# Patient Record
Sex: Female | Born: 1990 | Race: Black or African American | Hispanic: No | Marital: Single | Smoking: Never smoker
Health system: Southern US, Community
[De-identification: ages and names within clinical notes are randomized; demographics above are authoritative.]

## PROBLEM LIST (undated history)

## (undated) ENCOUNTER — Inpatient Hospital Stay (HOSPITAL_COMMUNITY): Payer: Self-pay

## (undated) ENCOUNTER — Inpatient Hospital Stay: Payer: Self-pay

## (undated) ENCOUNTER — Emergency Department (HOSPITAL_BASED_OUTPATIENT_CLINIC_OR_DEPARTMENT_OTHER): Payer: Self-pay | Source: Home / Self Care

## (undated) DIAGNOSIS — Z9109 Other allergy status, other than to drugs and biological substances: Secondary | ICD-10-CM

## (undated) DIAGNOSIS — O1490 Unspecified pre-eclampsia, unspecified trimester: Secondary | ICD-10-CM

---
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
## 2011-03-09 ENCOUNTER — Encounter (HOSPITAL_COMMUNITY): Payer: Self-pay

## 2011-03-09 ENCOUNTER — Inpatient Hospital Stay (HOSPITAL_COMMUNITY)
Admission: AD | Admit: 2011-03-09 | Discharge: 2011-03-09 | Disposition: A | Discharge status: Home / Self Care | Payer: Self-pay | Source: Ambulatory Visit | Attending: Obstetrics & Gynecology | Admitting: Obstetrics & Gynecology

## 2011-03-09 DIAGNOSIS — R3129 Other microscopic hematuria: Secondary | ICD-10-CM

## 2011-03-09 DIAGNOSIS — N898 Other specified noninflammatory disorders of vagina: Secondary | ICD-10-CM | POA: Insufficient documentation

## 2011-03-09 DIAGNOSIS — O99891 Other specified diseases and conditions complicating pregnancy: Secondary | ICD-10-CM | POA: Insufficient documentation

## 2011-03-09 DIAGNOSIS — Z3400 Encounter for supervision of normal first pregnancy, unspecified trimester: Secondary | ICD-10-CM

## 2011-03-09 LAB — URINALYSIS, ROUTINE W REFLEX MICROSCOPIC
Glucose, UA: NEGATIVE mg/dL
Nitrite: NEGATIVE
Specific Gravity, Urine: 1.015 (ref 1.005–1.030)
pH: 6.5 (ref 5.0–8.0)

## 2011-03-09 LAB — URINE MICROSCOPIC-ADD ON

## 2011-03-09 NOTE — Progress Notes (Signed)
Pt states started noticing red blood on toilet paper when wipes this morning. Reports positive fetal movement. Denies contractions.

## 2011-03-09 NOTE — ED Provider Notes (Signed)
Chief Complaint:  Vaginal Bleeding  Paula Castro is  20 y.o. G1P0000 at [redacted]w[redacted]d presents complaining of Vaginal Bleeding She reports noting some spots of blood on the tissue when she wipes after going to the bathroom to urinate.  Denies having hemorrhoids, recent intercourse or vulvovaginal irritation.  She denies LOF, contractions but reports good FM.  She gets her [facility] but plans to transfer her care to here as she lives here.   Obstetrical/Gynecological History: Menstrual History: OB History    Grav Para Term Preterm Abortions TAB SAB Ect Mult Living   1 0 0 0 0 0 0 0 0 0      No LMP recorded. Patient is pregnant.   Past Medical History: Past Medical History  Diagnosis Date  . No pertinent past medical history    Past Surgical History: Past Surgical History  Procedure Date  . Tonsillectomy 2004   Family History: No family history on file.  Social History: History  Substance Use Topics  . Smoking status: Never Smoker   . Smokeless tobacco: Not on file  . Alcohol Use: No    Allergies: No Known Allergies  Meds:  Prescriptions prior to admission  Medication Sig Dispense Refill  . prenatal vitamin w/FE, FA (PRENATAL 1 + 1) 27-1 MG TABS Take 1 tablet by mouth daily.        Marland Kitchen PRESCRIPTION MEDICATION Apply 1 application topically 2 (two) times daily as needed. For rash         Physical Exam  Blood pressure 136/83, temperature 98.4 F (36.9 C), temperature source Oral, resp. rate 20, height 5\' 3"  (1.6 m), weight 89.359 kg (197 lb), SpO2 97.00%. GENERAL: Well-developed, well-nourished female in no acute distress.  LUNGS: Clear to auscultation bilaterally.  HEART: Regular rate and rhythm. ABDOMEN: Soft, nontender, nondistended, gravid.  EXTREMITIES: Nontender, no edema, 2+ distal pulses. Speculum Exam: Closed cervix, white vaginal discharge.  No evidence of past or ongoing bleeding.  No lesions.  Normal external genitalia, no lesions, no evidence of  vulvovaginitis. FHT:  Baseline rate 150 bpm   Variability moderate  Accelerations absent   Decelerations none -> reassuring for GA Contractions: None   Labs: Recent Results (from the past 24 hour(s))  URINALYSIS, ROUTINE W REFLEX MICROSCOPIC   Collection Time   03/09/11  9:05 PM      Component Value Range   Color, Urine YELLOW  YELLOW    Appearance CLEAR  CLEAR    Specific Gravity, Urine 1.015  1.005 - 1.030    pH 6.5  5.0 - 8.0    Glucose, UA NEGATIVE  NEGATIVE (mg/dL)   Hgb urine dipstick TRACE (*) NEGATIVE    Bilirubin Urine NEGATIVE  NEGATIVE    Ketones, ur >80 (*) NEGATIVE (mg/dL)   Protein, ur NEGATIVE  NEGATIVE (mg/dL)   Urobilinogen, UA 0.2  0.0 - 1.0 (mg/dL)   Nitrite NEGATIVE  NEGATIVE    Leukocytes, UA LARGE (*) NEGATIVE   URINE MICROSCOPIC-ADD ON   Collection Time   03/09/11  9:05 PM      Component Value Range   Squamous Epithelial / LPF FEW (*) RARE    WBC, UA 21-50  <3 (WBC/hpf)   RBC / HPF 0-2  <3 (RBC/hpf)   Bacteria, UA RARE  RARE    Urine-Other MUCOUS PRESENT       Assessment: Paula Castro is  20 y.o. G1P0000 at [redacted]w[redacted]d presents with reported bleeding; no evidence/etiology of bleeding seen on exam.  UA ordered  to rule out UTI.  Plan: UA showed large LE and trace hemoglobin, no nitrites.  Will send for culture and manage accordingly, low suspicion for UTI.  No evidence of kidney stones. No other bleeding noted. Reassuring FHT. Discharge to home; FM/PTL and bleeding precautions reviewed. Clinic number given to patient; she will call and make appointment and arrange for transfer of records.  Paula Castro A 9/1/20129:39 PM

## 2011-03-12 ENCOUNTER — Encounter (HOSPITAL_COMMUNITY): Payer: Self-pay | Admitting: *Deleted

## 2011-03-12 ENCOUNTER — Inpatient Hospital Stay (HOSPITAL_COMMUNITY)
Admission: AD | Admit: 2011-03-12 | Discharge: 2011-03-12 | Disposition: A | Discharge status: Home / Self Care | Payer: Medicaid Other | Source: Ambulatory Visit | Attending: Obstetrics & Gynecology | Admitting: Obstetrics & Gynecology

## 2011-03-12 DIAGNOSIS — O2340 Unspecified infection of urinary tract in pregnancy, unspecified trimester: Secondary | ICD-10-CM

## 2011-03-12 DIAGNOSIS — O2390 Unspecified genitourinary tract infection in pregnancy, unspecified trimester: Secondary | ICD-10-CM | POA: Insufficient documentation

## 2011-03-12 DIAGNOSIS — B9689 Other specified bacterial agents as the cause of diseases classified elsewhere: Secondary | ICD-10-CM | POA: Insufficient documentation

## 2011-03-12 DIAGNOSIS — N390 Urinary tract infection, site not specified: Secondary | ICD-10-CM | POA: Insufficient documentation

## 2011-03-12 HISTORY — DX: Other allergy status, other than to drugs and biological substances: Z91.09

## 2011-03-12 LAB — URINE CULTURE

## 2011-03-12 MED ORDER — NITROFURANTOIN MONOHYD MACRO 100 MG PO CAPS: 100.0000 mg | ORAL_CAPSULE | Freq: Two times a day (BID) | ORAL | Status: AC

## 2011-03-12 NOTE — ED Provider Notes (Signed)
Chief Complaint:  No chief complaint on file.   Paula Castro is  20 y.o. G1P0000 at [redacted]w[redacted]d presents complaining of blood on toilet paper after urination.  She was seen in MAU on 9/2 for the same issue and told to return if additional episodes of bleeding.  She has noticed blood with wiping after urination yesterday and today.  Also complains of suprapubic pain, no dysuria.  Denies fever, chills, n/v, diarrhea, constipation. A UA done on the 2nd showed LE, few red cells, and many white cells.  A culture was done and is showing >55,000 colonies of gram negative rods.  No contractions, loss of fluid or vaginal bleeding.  Reports good fetal movement.     Obstetrical/Gynecological History: OB History    Grav Para Term Preterm Abortions TAB SAB Ect Mult Living   1 0 0 0 0 0 0 0 0 0       Past Medical History: Past Medical History  Diagnosis Date  . Environmental allergies     Past Surgical History: Past Surgical History  Procedure Date  . Tonsillectomy 2004    Family History: No family history on file.  Social History: History  Substance Use Topics  . Smoking status: Never Smoker   . Smokeless tobacco: Never Used  . Alcohol Use: No    Allergies: No Known Allergies  Prescriptions prior to admission  Medication Sig Dispense Refill  . clobetasol (TEMOVATE) 0.05 % ointment Apply 1 application topically 2 (two) times daily as needed. For rash/itching       . prenatal vitamin w/FE, FA (PRENATAL 1 + 1) 27-1 MG TABS Take 1 tablet by mouth daily.       Marland Kitchen PRESCRIPTION MEDICATION Apply 1 application topically 2 (two) times daily as needed. For rash        Review of Systems - Negative except as in HPI  Physical Exam   Blood pressure 143/74, pulse 106, temperature 99.2 F (37.3 C), temperature source Oral, resp. rate 20, height 5\' 3"  (1.6 m), weight 193 lb 3.2 oz (87.635 kg), SpO2 100.00%.  General: General appearance - alert, well appearing, and in no distress Eyes - sclera  anicteric Mouth - mucous membranes moist, pharynx normal without lesions Chest - clear to auscultation, no wheezes, rales or rhonchi, symmetric air entry Heart - normal rate, regular rhythm, normal S1, S2, no murmurs, rubs, clicks or gallops Abdomen - soft, +BS, gravid Extremities - peripheral pulses normal, no pedal edema, no clubbing or cyanosis Skin - normal coloration and turgor, no rashes, no suspicious skin lesions noted Baseline: 150 bpm, Variability: Good {> 6 bpm), Accelerations: Reactive and Decelerations: Absent none Cervix: closed/thick/posterior  Labs: No results found for this or any previous visit (from the past 24 hour(s)). Imaging Studies:  No results found.   Assessment: Paula Castro is  20 y.o. G1P0000 at [redacted]w[redacted]d presents with minimal bleeding after urination.  Urine culture from 9/2 growing >55,000 colonies of gram negative rods.  Plan: -Macrobid 100mg  BID x7 days -preterm labor handout provided -discharge home  Patient and plan discussed with Cam Hai, CNM BOOTH, ERIN9/4/20126:04 PM

## 2011-03-12 NOTE — Progress Notes (Signed)
Pt states she was seen in MAU on 9-2 and had some blood and WBC's in her urine. Was instructed if she saw blood again to return to MAU. Pt is not having any pain and only sees the blood with wiping after urinating. Pt gets prenatal care in [place] but plans to transfer to [place]. No contractions or leaking.

## 2011-03-18 MED ORDER — METRONIDAZOLE 500 MG PO TABS: 500.0000 mg | ORAL_TABLET | Freq: Two times a day (BID) | ORAL | Status: AC

## 2011-03-18 MED ORDER — CEPHALEXIN 500 MG PO CAPS: 500.0000 mg | ORAL_CAPSULE | Freq: Three times a day (TID) | ORAL | Status: AC

## 2011-03-18 NOTE — Progress Notes (Signed)
Quick Note:  I called patient with the results below. Discussed Trichomonas diagnosis; advised to get partner tested and also advised full STI screening by her OB provider. E-prescribed seven day courses of Keflex 500 mg po tid (for her UTI) and Flagyl 500 mg po bid (Trichomonas). She will follow up with her OB provider.  Jaslyn Bansal A 03/18/2011 4:46 PM   ______

## 2011-03-18 NOTE — Progress Notes (Signed)
Encounter addended by: Tereso Newcomer, MD on: 03/18/2011  4:50 PM<BR>     Documentation filed: Orders

## 2011-03-19 ENCOUNTER — Inpatient Hospital Stay (HOSPITAL_COMMUNITY)
Admission: AD | Admit: 2011-03-19 | Discharge: 2011-03-19 | Disposition: A | Discharge status: Home / Self Care | Payer: Self-pay | Source: Ambulatory Visit | Attending: Obstetrics and Gynecology | Admitting: Obstetrics and Gynecology

## 2011-03-19 NOTE — Plan of Care (Signed)
Pt and her mother in to triage. Pt had been seen in MAU 9-1 and given medication for a UTI. Dr. Macon Large had called the patient last night with the result of the urine culture done on that date. The urine had indicated trich on the micro and the MD had talked with the patient about this result and called in an Rx for treatment and indicated the FOB needed to be treated. Pt and her mother are confused about this result. Pt states she has received care in [facility] and had tested negative for everything in the beginning of the pregnancy. Pt had questioned our result and wanted to be retested.  This RN pulled lab results and talked with the patient and her mother about the results. Indicated to both that the lab is accurate and that both she and her partner need to be treated as indicated by the MD.. Pt states that her partner told her that he was tested yesterday and did not have anything.  This RN reassured the patient that both needed to be treated, reemphasizing the MD instructions. Pt now understands better the result and does not want to have further testing done. Pt states she will follow up with her primary provider in [place]. Pt and mother left MAU.

## 2011-03-23 NOTE — ED Provider Notes (Signed)
I have discussed this pt with Dr Elwyn Reach and I agree with the above. Paula Castro 8:17 PM 03/23/2011

## 2011-08-22 ENCOUNTER — Encounter (HOSPITAL_COMMUNITY): Payer: Self-pay | Admitting: *Deleted

## 2011-09-23 ENCOUNTER — Emergency Department: Payer: Self-pay | Admitting: Emergency Medicine

## 2013-06-26 ENCOUNTER — Emergency Department: Payer: Self-pay | Admitting: Emergency Medicine

## 2013-06-28 LAB — BETA STREP CULTURE(ARMC)

## 2013-10-15 ENCOUNTER — Emergency Department: Payer: Self-pay | Admitting: Emergency Medicine

## 2013-10-15 LAB — URINALYSIS, COMPLETE
BACTERIA: NONE SEEN
BILIRUBIN, UR: NEGATIVE
Blood: NEGATIVE
Glucose,UR: NEGATIVE mg/dL (ref 0–75)
KETONE: NEGATIVE
LEUKOCYTE ESTERASE: NEGATIVE
NITRITE: NEGATIVE
PH: 7 (ref 4.5–8.0)
Protein: NEGATIVE
RBC,UR: <1 /HPF (ref 0–5)
Specific Gravity: 1.024 (ref 1.003–1.030)

## 2013-10-15 LAB — COMPREHENSIVE METABOLIC PANEL
ALK PHOS: 76 U/L
ALT: 18 U/L (ref 12–78)
AST: 17 U/L (ref 15–37)
Albumin: 3.8 g/dL (ref 3.4–5.0)
Anion Gap: 5 — ABNORMAL LOW (ref 7–16)
BILIRUBIN TOTAL: 0.2 mg/dL (ref 0.2–1.0)
BUN: 10 mg/dL (ref 7–18)
CREATININE: 0.79 mg/dL (ref 0.60–1.30)
Calcium, Total: 9.1 mg/dL (ref 8.5–10.1)
Chloride: 108 mmol/L — ABNORMAL HIGH (ref 98–107)
Co2: 27 mmol/L (ref 21–32)
EGFR (African American): >60
EGFR (Non-African Amer.): >60
GLUCOSE: 101 mg/dL — AB (ref 65–99)
Osmolality: 279 (ref 275–301)
POTASSIUM: 3.7 mmol/L (ref 3.5–5.1)
Sodium: 140 mmol/L (ref 136–145)
Total Protein: 8 g/dL (ref 6.4–8.2)

## 2013-10-15 LAB — CBC
HCT: 40 % (ref 35.0–47.0)
HGB: 13.5 g/dL (ref 12.0–16.0)
MCH: 31.1 pg (ref 26.0–34.0)
MCHC: 33.8 g/dL (ref 32.0–36.0)
MCV: 92 fL (ref 80–100)
PLATELETS: 193 10*3/uL (ref 150–440)
RBC: 4.35 10*6/uL (ref 3.80–5.20)
RDW: 12.7 % (ref 11.5–14.5)
WBC: 5.7 10*3/uL (ref 3.6–11.0)

## 2013-10-16 ENCOUNTER — Encounter (HOSPITAL_COMMUNITY): Payer: Self-pay | Admitting: *Deleted

## 2013-10-16 ENCOUNTER — Inpatient Hospital Stay (HOSPITAL_COMMUNITY)
Admission: AD | Admit: 2013-10-16 | Discharge: 2013-10-16 | Disposition: A | Discharge status: Home / Self Care | Payer: Self-pay | Source: Ambulatory Visit | Attending: Obstetrics & Gynecology | Admitting: Obstetrics & Gynecology

## 2013-10-16 DIAGNOSIS — R4189 Other symptoms and signs involving cognitive functions and awareness: Secondary | ICD-10-CM

## 2013-10-16 DIAGNOSIS — R419 Unspecified symptoms and signs involving cognitive functions and awareness: Secondary | ICD-10-CM

## 2013-10-16 DIAGNOSIS — N898 Other specified noninflammatory disorders of vagina: Secondary | ICD-10-CM | POA: Insufficient documentation

## 2013-10-16 LAB — URINALYSIS, ROUTINE W REFLEX MICROSCOPIC
BILIRUBIN URINE: NEGATIVE
GLUCOSE, UA: NEGATIVE mg/dL
HGB URINE DIPSTICK: NEGATIVE
KETONES UR: NEGATIVE mg/dL
Leukocytes, UA: NEGATIVE
Nitrite: NEGATIVE
PH: 7 (ref 5.0–8.0)
Protein, ur: NEGATIVE mg/dL
SPECIFIC GRAVITY, URINE: 1.02 (ref 1.005–1.030)
Urobilinogen, UA: 0.2 mg/dL (ref 0.0–1.0)

## 2013-10-16 LAB — WET PREP, GENITAL
Clue Cells Wet Prep HPF POC: NONE SEEN
TRICH WET PREP: NONE SEEN
YEAST WET PREP: NONE SEEN

## 2013-10-16 LAB — GC/CHLAMYDIA PROBE AMP

## 2013-10-16 NOTE — MAU Provider Note (Signed)
History     CSN: [id number]  Arrival date and time: 10/16/13 3832   First Provider Initiated Contact with Patient 10/16/13 1124      Chief Complaint  Patient presents with  . Vaginal Discharge   HPI Paula Castro 22 y.o. Client went to [facility] yesterday for discharge and was not told anything.  Comes here today for discharge with odor.  Thinks she has BV.  OB History   Grav Para Term Preterm Abortions TAB SAB Ect Mult Living   1 0 0 0 0 0 0 0 0 0       Past Medical History  Diagnosis Date  . Environmental allergies     Past Surgical History  Procedure Laterality Date  . Tonsillectomy  2004    History reviewed. No pertinent family history.  History  Substance Use Topics  . Smoking status: Never Smoker   . Smokeless tobacco: Never Used  . Alcohol Use: No    Allergies: No Known Allergies  Prescriptions prior to admission  Medication Sig Dispense Refill  . acetaminophen (TYLENOL) 325 MG tablet Take 650 mg by mouth every 6 (six) hours as needed for moderate pain.        Review of Systems  Constitutional: Negative for fever.  Gastrointestinal: Negative for nausea, vomiting, abdominal pain, diarrhea and constipation.  Genitourinary:       Vaginal discharge. No vaginal bleeding. No dysuria.   Physical Exam   Blood pressure 145/81, pulse 88, temperature 97.9 F (36.6 C), resp. rate 18, height 5\' 2"  (1.575 m), weight 163 lb (73.936 kg), last menstrual period 10/06/2013, unknown if currently breastfeeding.  Physical Exam  Nursing note and vitals reviewed. Constitutional: She is oriented to person, place, and time. She appears well-developed and well-nourished. No distress.  HENT:  Head: Normocephalic.  Eyes: EOM are normal.  Neck: Neck supple.  GI: Soft. There is no tenderness.  Genitourinary:  Speculum exam: Vagina - Minimal amount of creamy discharge, no odor Cervix - No contact bleeding Bimanual exam: Cervix closed Uterus non tender, normal  size Adnexa non tender, no masses bilaterally GC/Chlam, wet prep done Chaperone present for exam.  Musculoskeletal: Normal range of motion.  Neurological: She is alert and oriented to person, place, and time.  Skin: Skin is warm and dry.  Psychiatric: She has a normal mood and affect.    MAU Course  Procedures  MDM Results for orders placed during the hospital encounter of 10/16/13 (from the past 24 hour(s))  URINALYSIS, ROUTINE W REFLEX MICROSCOPIC     Status: None   Collection Time    10/16/13 10:18 AM      Result Value Ref Range   Color, Urine YELLOW  YELLOW   APPearance CLEAR  CLEAR   Specific Gravity, Urine 1.020  1.005 - 1.030   pH 7.0  5.0 - 8.0   Glucose, UA NEGATIVE  NEGATIVE mg/dL   Hgb urine dipstick NEGATIVE  NEGATIVE   Bilirubin Urine NEGATIVE  NEGATIVE   Ketones, ur NEGATIVE  NEGATIVE mg/dL   Protein, ur NEGATIVE  NEGATIVE mg/dL   Urobilinogen, UA 0.2  0.0 - 1.0 mg/dL   Nitrite NEGATIVE  NEGATIVE   Leukocytes, UA NEGATIVE  NEGATIVE  WET PREP, GENITAL     Status: Abnormal   Collection Time    10/16/13 11:29 AM      Result Value Ref Range   Yeast Wet Prep HPF POC NONE SEEN  NONE SEEN   Trich, Wet Prep NONE SEEN  NONE  SEEN   Clue Cells Wet Prep HPF POC NONE SEEN  NONE SEEN   WBC, Wet Prep HPF POC MODERATE (*) NONE SEEN     Assessment and Plan  No infection found. Normal exam  Plan Cultures pending. Establish with a provider for medical concerns. Will contact you with any positive results for treatment.  Havard Radigan L Render Marley 10/16/2013, 12:19 PM

## 2013-10-16 NOTE — MAU Note (Signed)
Pt presents with complaints of a vaginal discharge. She says she went to [facility] last night and was told she had a pelvic infection but they  did not treat with any meds and she states she still has a vaginal discharge.

## 2013-10-16 NOTE — Discharge Instructions (Signed)
Establish with a health care provider to be able to see you for your medical needs.

## 2013-10-16 NOTE — MAU Provider Note (Signed)
Attestation of Attending Supervision of Advanced Practitioner (PA/CNM/NP): Evaluation and management procedures were performed by the Advanced Practitioner under my supervision and collaboration.  I have reviewed the Advanced Practitioner's note and chart, and I agree with the management and plan.  Ulonda Klosowski, MD, FACOG Attending Obstetrician & Gynecologist Faculty Practice, [facility]  

## 2013-10-18 LAB — POCT PREGNANCY, URINE: PREG TEST UR: NEGATIVE

## 2013-10-18 LAB — GC/CHLAMYDIA PROBE AMP
CT Probe RNA: NEGATIVE
GC PROBE AMP APTIMA: NEGATIVE

## 2013-11-30 ENCOUNTER — Emergency Department: Payer: Self-pay | Admitting: Emergency Medicine

## 2014-05-09 ENCOUNTER — Encounter (HOSPITAL_COMMUNITY): Payer: Self-pay | Admitting: *Deleted

## 2015-05-03 ENCOUNTER — Encounter: Payer: Self-pay | Admitting: Emergency Medicine

## 2015-05-03 ENCOUNTER — Emergency Department
Admission: EM | Admit: 2015-05-03 | Discharge: 2015-05-03 | Disposition: A | Discharge status: Home / Self Care | Payer: Self-pay | Attending: Emergency Medicine | Admitting: Emergency Medicine

## 2015-05-03 DIAGNOSIS — N3000 Acute cystitis without hematuria: Secondary | ICD-10-CM | POA: Insufficient documentation

## 2015-05-03 DIAGNOSIS — Z3202 Encounter for pregnancy test, result negative: Secondary | ICD-10-CM | POA: Insufficient documentation

## 2015-05-03 LAB — URINALYSIS COMPLETE WITH MICROSCOPIC (ARMC ONLY)
BACTERIA UA: NONE SEEN
BILIRUBIN URINE: NEGATIVE
Glucose, UA: NEGATIVE mg/dL
KETONES UR: NEGATIVE mg/dL
NITRITE: NEGATIVE
PH: 5 (ref 5.0–8.0)
PROTEIN: 100 mg/dL — AB
Specific Gravity, Urine: 1.025 (ref 1.005–1.030)

## 2015-05-03 LAB — POCT PREGNANCY, URINE: Preg Test, Ur: NEGATIVE

## 2015-05-03 MED ORDER — CIPROFLOXACIN HCL 500 MG PO TABS: 500.0000 mg | ORAL_TABLET | Freq: Two times a day (BID) | ORAL | Status: DC

## 2015-05-03 MED ORDER — CEPHALEXIN 500 MG PO CAPS
500.0000 mg | ORAL_CAPSULE | Freq: Once | ORAL | Status: AC
  Administered 2015-05-03: 500 mg via ORAL
  Filled 2015-05-03: qty 1

## 2015-05-03 MED ORDER — CIPROFLOXACIN HCL 500 MG PO TABS: 500.0000 mg | ORAL_TABLET | Freq: Once | ORAL | Status: DC

## 2015-05-03 MED ORDER — CEPHALEXIN 500 MG PO CAPS: 500.0000 mg | ORAL_CAPSULE | Freq: Two times a day (BID) | ORAL | Status: AC

## 2015-05-03 NOTE — ED Notes (Signed)
Pt uprite on stretcher in exam room with no distress noted; pt reports pelvic pressure with urination and light pink blood; denies abd pain or back pain; st hx of same with UTI

## 2015-05-03 NOTE — ED Notes (Signed)
Pt presents to ED with lower abd / pelvic pressure since yesterday. Denies similar symptoms previously. Pt states she also noticed a light pink blood when wiping yesterda evening and today.

## 2015-05-03 NOTE — Discharge Instructions (Signed)

## 2015-05-03 NOTE — ED Provider Notes (Signed)
[facility] Emergency Department Provider Note  ____________________________________________  Time seen: 6:30 AM  I have reviewed the triage vital signs and the nursing notes.   HISTORY  Chief Complaint Hematuria and Pelvic Pain     HPI Paula Castro is a 24 y.o. female presents with pelvic pressure and light pink blood with wiping noted yesterday evening. Patient denies any dysuria no vaginal discharge patient describes pelvic pressure. Patient denies any fever    Past Medical History  Diagnosis Date  . Environmental allergies     Patient Active Problem List   Diagnosis Date Noted  . Hematuria, microscopic 03/09/2011  . Supervision of normal first pregnancy 03/09/2011    Past Surgical History  Procedure Laterality Date  . Tonsillectomy  2004    Current Outpatient Rx  Name  Route  Sig  Dispense  Refill  . acetaminophen (TYLENOL) 325 MG tablet   Oral   Take 650 mg by mouth every 6 (six) hours as needed for moderate pain.           Allergies No known drug allergies No family history on file.  Social History Social History  Substance Use Topics  . Smoking status: Never Smoker   . Smokeless tobacco: Never Used  . Alcohol Use: No    Review of Systems  Constitutional: Negative for fever. Eyes: Negative for visual changes. ENT: Negative for sore throat. Cardiovascular: Negative for chest pain. Respiratory: Negative for shortness of breath. Gastrointestinal: Negative for abdominal pain, vomiting and diarrhea. Genitourinary: Negative for dysuria. Positive for pelvic pressure Musculoskeletal: Negative for back pain. Skin: Negative for rash. Neurological: Negative for headaches, focal weakness or numbness.   10-point ROS otherwise negative.  ____________________________________________   PHYSICAL EXAM:  VITAL SIGNS: ED Triage Vitals  Enc Vitals Group     BP 05/03/15 0529 143/82 mmHg     Pulse Rate 05/03/15 0529 100   Resp 05/03/15 0529 18     Temp 05/03/15 0529 98.2 F (36.8 C)     Temp Source 05/03/15 0529 Oral     SpO2 05/03/15 0529 100 %     Weight 05/03/15 0529 174 lb (78.926 kg)     Height 05/03/15 0529 5\' 4"  (1.626 m)     Head Cir --      Peak Flow --      Pain Score 05/03/15 0530 6     Pain Loc --      Pain Edu? --      Excl. in GC? --     Constitutional: Alert and oriented. Well appearing and in no distress. Eyes: Conjunctivae are normal. PERRL. Normal extraocular movements. ENT   Head: Normocephalic and atraumatic.   Nose: No congestion/rhinnorhea.   Mouth/Throat: Mucous membranes are moist.   Neck: No stridor. Hematological/Lymphatic/Immunilogical: No cervical lymphadenopathy. Cardiovascular: Normal rate, regular rhythm. Normal and symmetric distal pulses are present in all extremities. No murmurs, rubs, or gallops. Respiratory: Normal respiratory effort without tachypnea nor retractions. Breath sounds are clear and equal bilaterally. No wheezes/rales/rhonchi. Gastrointestinal: Soft and nontender. No distention. There is no CVA tenderness. Genitourinary: deferred Musculoskeletal: Nontender with normal range of motion in all extremities. No joint effusions.  No lower extremity tenderness nor edema. Neurologic:  Normal speech and language. No gross focal neurologic deficits are appreciated. Speech is normal.  Skin:  Skin is warm, dry and intact. No rash noted. Psychiatric: Mood and affect are normal. Speech and behavior are normal. Patient exhibits appropriate insight and judgment.  ____________________________________________  LABS (pertinent positives/negatives)  Labs Reviewed  URINALYSIS COMPLETEWITH MICROSCOPIC ([facility] ONLY) - Abnormal; Notable for the following:    Color, Urine YELLOW (*)    APPearance TURBID (*)    Hgb urine dipstick 3+ (*)    Protein, ur 100 (*)    Leukocytes, UA 3+ (*)    Squamous Epithelial / LPF 0-5 (*)    All other components within  normal limits  POC URINE PREG, ED  POCT PREGNANCY, URINE        INITIAL IMPRESSION / ASSESSMENT AND PLAN / ED COURSE  Pertinent labs & imaging results that were available during my care of the patient were reviewed by me and considered in my medical decision making (see chart for details).  Strip physical exam consistent with urinary tract infection. Patient concerned about possibility of pregnancy stating that she had unprotected sex last week with her sexual partner ejaculated in the vaginal vault. I advised the patient to perform urine pregnancy test in approximately 2 weeks to confirm possibility of pregnancy.  ____________________________________________   FINAL CLINICAL IMPRESSION(S) / ED DIAGNOSES  Final diagnoses:  Acute cystitis without hematuria      Darci Current, MD 05/03/15 [phone]

## 2015-06-04 ENCOUNTER — Encounter: Payer: Self-pay | Admitting: Emergency Medicine

## 2015-06-04 ENCOUNTER — Emergency Department
Admission: EM | Admit: 2015-06-04 | Discharge: 2015-06-04 | Disposition: A | Discharge status: Home / Self Care | Payer: Self-pay | Attending: Emergency Medicine | Admitting: Emergency Medicine

## 2015-06-04 DIAGNOSIS — B9689 Other specified bacterial agents as the cause of diseases classified elsewhere: Secondary | ICD-10-CM

## 2015-06-04 DIAGNOSIS — R300 Dysuria: Secondary | ICD-10-CM

## 2015-06-04 DIAGNOSIS — N760 Acute vaginitis: Secondary | ICD-10-CM | POA: Insufficient documentation

## 2015-06-04 LAB — WET PREP, GENITAL
Sperm: NONE SEEN
Trich, Wet Prep: NONE SEEN
Yeast Wet Prep HPF POC: NONE SEEN

## 2015-06-04 LAB — URINALYSIS COMPLETE WITH MICROSCOPIC (ARMC ONLY)
BILIRUBIN URINE: NEGATIVE
Bacteria, UA: NONE SEEN
Glucose, UA: NEGATIVE mg/dL
Hgb urine dipstick: NEGATIVE
KETONES UR: NEGATIVE mg/dL
Leukocytes, UA: NEGATIVE
Nitrite: NEGATIVE
PH: 8 (ref 5.0–8.0)
Protein, ur: NEGATIVE mg/dL
RBC / HPF: NONE SEEN RBC/hpf (ref 0–5)
Specific Gravity, Urine: 1.018 (ref 1.005–1.030)

## 2015-06-04 LAB — CHLAMYDIA/NGC RT PCR (ARMC ONLY)
CHLAMYDIA TR: NOT DETECTED
N GONORRHOEAE: NOT DETECTED

## 2015-06-04 MED ORDER — METRONIDAZOLE 500 MG PO TABS: 500.0000 mg | ORAL_TABLET | Freq: Two times a day (BID) | ORAL | Status: AC

## 2015-06-04 NOTE — ED Notes (Addendum)
Patient states that she had a UTI about 1 month ago, patient states that her symptoms never fully went away. Patient is having increased urination with little output. Patient denies vaginal discharge and bleeding. Patient states that she is having some nausea. Has felt "hot" but has not taken her temperature. Patient is having lower back pain on both sides.

## 2015-06-04 NOTE — ED Notes (Signed)
Patient presents to the ED with dysuria, frequency, and halting urine for about 1 month.  Patient states she was treated with antibiotics for a UTI about 1 month ago but symptoms have not improved.  Patient is in no obvious distress at this time.

## 2015-06-04 NOTE — ED Provider Notes (Signed)
[facility] Emergency Department Provider Note ____________________________________________  Time seen: 1245  I have reviewed the triage vital signs and the nursing notes.  HISTORY  Chief Complaint  Dysuria  HPI Paula Castro is a 24 y.o. female reports to the ED for evaluation of bilateral flank pain and some urinary hesitation. She describes being treated for UTI about a month ago, but denies feeling as if she was completely cured after completing the antibiotic dose. She describes in the last week or so she's noted some increased nausea without vomiting, as well as some chills and subjective fevers. She denies any frank hematuria, pelvic pain, or outright dysuria. She also is without any vaginal discharge or irregular bleeding.  Past Medical History  Diagnosis Date  . Environmental allergies     Patient Active Problem List   Diagnosis Date Noted  . Hematuria, microscopic 03/09/2011  . Supervision of normal first pregnancy 03/09/2011    Past Surgical History  Procedure Laterality Date  . Tonsillectomy  2004   Current Outpatient Rx  Name  Route  Sig  Dispense  Refill  . metroNIDAZOLE (FLAGYL) 500 MG tablet   Oral   Take 1 tablet (500 mg total) by mouth 2 (two) times daily.   14 tablet   0     Allergies Review of patient's allergies indicates no known allergies.  No family history on file.  Social History Social History  Substance Use Topics  . Smoking status: Never Smoker   . Smokeless tobacco: Never Used  . Alcohol Use: No   Review of Systems  Constitutional: Negative for fever. Eyes: Negative for visual changes. ENT: Negative for sore throat. Cardiovascular: Negative for chest pain. Respiratory: Negative for shortness of breath. Gastrointestinal: Negative for abdominal pain, vomiting and diarrhea. Genitourinary: Negative for dysuria.  Musculoskeletal: Negative for back pain. Skin: Negative for rash. Neurological: Negative for  headaches, focal weakness or numbness. ____________________________________________  PHYSICAL EXAM:  VITAL SIGNS: ED Triage Vitals  Enc Vitals Group     BP 06/04/15 1231 140/84 mmHg     Pulse Rate 06/04/15 1231 103     Resp 06/04/15 1231 18     Temp 06/04/15 1231 97.7 F (36.5 C)     Temp src --      SpO2 06/04/15 1231 100 %     Weight 06/04/15 1231 175 lb (79.379 kg)     Height 06/04/15 1231  (1.626 m)     Head Cir --      Peak Flow --      Pain Score 06/04/15 1221 7     Pain Loc --      Pain Edu? --      Excl. in GC? --    Constitutional: Alert and oriented. Well appearing and in no distress. Head: Normocephalic and atraumatic.      Eyes: Conjunctivae are normal. PERRL. Normal extraocular movements      Ears: Canals clear. TMs intact bilaterally.   Nose: No congestion/rhinorrhea.   Mouth/Throat: Mucous membranes are moist.   Neck: Supple. No thyromegaly. Hematological/Lymphatic/Immunological: No cervical lymphadenopathy. Cardiovascular: Normal rate, regular rhythm.  Respiratory: Normal respiratory effort. No wheezes/rales/rhonchi. Gastrointestinal: Soft and nontender. No distention. GU: Normal external genitalia joint exam. Vaginal vault with a moderate amount of thin white discharge noted. Musculoskeletal: Nontender with normal range of motion in all extremities.  Neurologic:  Normal gait without ataxia. Normal speech and language. No gross focal neurologic deficits are appreciated. Skin:  Skin is warm, dry  and intact. No rash noted. Psychiatric: Mood and affect are normal. Patient exhibits appropriate insight and judgment. ____________________________________________   LABS (pertinent positives/negatives) Labs Reviewed  WET PREP, GENITAL - Abnormal; Notable for the following:    Clue Cells Wet Prep HPF POC FEW (*)    WBC, Wet Prep HPF POC FEW (*)    All other components within normal limits  URINALYSIS COMPLETEWITH MICROSCOPIC ([facility] ONLY) - Abnormal;  Notable for the following:    Color, Urine YELLOW (*)    APPearance CLEAR (*)    Squamous Epithelial / LPF 0-5 (*)    All other components within normal limits  CHLAMYDIA/NGC RT PCR ([facility] ONLY)  ____________________________________________  INITIAL IMPRESSION / ASSESSMENT AND PLAN / ED COURSE  Patient with dysuria without laboratory evidence of an acute cystitis. She'll be treated for a bacterial vaginosis with Flagyl. Gonorrhea chlamydia is pending at the time of discharge. Patient will be called with results if treatment is warranted, and advised to return to the ED or her provider for treatment. ____________________________________________  FINAL CLINICAL IMPRESSION(S) / ED DIAGNOSES  Final diagnoses:  BV (bacterial vaginosis)  Dysuria      Lissa Hoard, PA-C 06/04/15 1613  Jene Every, MD 06/05/15 1450

## 2015-06-04 NOTE — Discharge Instructions (Signed)
Bacterial Vaginosis Bacterial vaginosis is a vaginal infection that occurs when the normal balance of bacteria in the vagina is disrupted. It results from an overgrowth of certain bacteria. This is the most common vaginal infection in women of childbearing age. Treatment is important to prevent complications, especially in pregnant women, as it can cause a premature delivery. CAUSES  Bacterial vaginosis is caused by an increase in harmful bacteria that are normally present in smaller amounts in the vagina. Several different kinds of bacteria can cause bacterial vaginosis. However, the reason that the condition develops is not fully understood. RISK FACTORS Certain activities or behaviors can put you at an increased risk of developing bacterial vaginosis, including:  Having a new sex partner or multiple sex partners.  Douching.  Using an intrauterine device (IUD) for contraception. Women do not get bacterial vaginosis from toilet seats, bedding, swimming pools, or contact with objects around them. SIGNS AND SYMPTOMS  Some women with bacterial vaginosis have no signs or symptoms. Common symptoms include:  Grey vaginal discharge.  A fishlike odor with discharge, especially after sexual intercourse.  Itching or burning of the vagina and vulva.  Burning or pain with urination. DIAGNOSIS  Your health care provider will take a medical history and examine the vagina for signs of bacterial vaginosis. A sample of vaginal fluid may be taken. Your health care provider will look at this sample under a microscope to check for bacteria and abnormal cells. A vaginal pH test may also be done.  TREATMENT  Bacterial vaginosis may be treated with antibiotic medicines. These may be given in the form of a pill or a vaginal cream. A second round of antibiotics may be prescribed if the condition comes back after treatment. Because bacterial vaginosis increases your risk for sexually transmitted diseases, getting  treated can help reduce your risk for chlamydia, gonorrhea, HIV, and herpes. HOME CARE INSTRUCTIONS   Only take over-the-counter or prescription medicines as directed by your health care provider.  If antibiotic medicine was prescribed, take it as directed. Make sure you finish it even if you start to feel better.  Tell all sexual partners that you have a vaginal infection. They should see their health care provider and be treated if they have problems, such as a mild rash or itching.  During treatment, it is important that you follow these instructions:  Avoid sexual activity or use condoms correctly.  Do not douche.  Avoid alcohol as directed by your health care provider.  Avoid breastfeeding as directed by your health care provider. SEEK MEDICAL CARE IF:   Your symptoms are not improving after 3 days of treatment.  You have increased discharge or pain.  You have a fever. MAKE SURE YOU:   Understand these instructions.  Will watch your condition.  Will get help right away if you are not doing well or get worse. FOR MORE INFORMATION  Centers for Disease Control and Prevention, Division of STD Prevention: [employer] American Sexual Health Association (ASHA): www.ashastd.org    This information is not intended to replace advice given to you by your health care provider. Make sure you discuss any questions you have with your health care provider.   Document Released: 06/24/2005 Document Revised: 07/15/2014 Document Reviewed: 02/03/2013 Elsevier Interactive Patient Education 2016 [employer]vier Inc[employer]  Dysuria Dysuria is pain or discomfort while urinating. The pain or discomfort may be felt in the tube that carries urine out of the bladder (urethra) or in the surrounding tissue of the genitals. The pain  may also be felt in the groin area, lower abdomen, and lower back. You may have to urinate frequently or have the sudden feeling that you have to urinate (urgency). Dysuria can  affect both men and women, but is more common in women. Dysuria can be caused by many different things, including:  Urinary tract infection in women.  Infection of the kidney or bladder.  Kidney stones or bladder stones.  Certain sexually transmitted infections (STIs), such as chlamydia.  Dehydration.  Inflammation of the vagina.  Use of certain medicines.  Use of certain soaps or scented products that cause irritation. HOME CARE INSTRUCTIONS Watch your dysuria for any changes. The following actions may help to reduce any discomfort you are feeling:  Drink enough fluid to keep your urine clear or pale yellow.  Empty your bladder often. Avoid holding urine for long periods of time.  After a bowel movement or urination, women should cleanse from front to back, using each tissue only once.  Empty your bladder after sexual intercourse.  Take medicines only as directed by your health care provider.  If you were prescribed an antibiotic medicine, finish it all even if you start to feel better.  Avoid caffeine, tea, and alcohol. They can irritate the bladder and make dysuria worse. In men, alcohol may irritate the prostate.  Keep all follow-up visits as directed by your health care provider. This is important.  If you had any tests done to find the cause of dysuria, it is your responsibility to obtain your test results. Ask the lab or department performing the test when and how you will get your results. Talk with your health care provider if you have any questions about your results. SEEK MEDICAL CARE IF:  You develop pain in your back or sides.  You have a fever.  You have nausea or vomiting.  You have blood in your urine.  You are not urinating as often as you usually do. SEEK IMMEDIATE MEDICAL CARE IF:  You pain is severe and not relieved with medicines.  You are unable to hold down any fluids.  You or someone else notices a change in your mental function.  You  have a rapid heartbeat at rest.  You have shaking or chills.  You feel extremely weak.   This information is not intended to replace advice given to you by your health care provider. Make sure you discuss any questions you have with your health care provider.   Document Released: 03/22/2004 Document Revised: 07/15/2014 Document Reviewed: 02/17/2014 Elsevier Interactive Patient Education 2016 [employer].  You should take the prescription antibiotic as directed. Follow-up with your provider for ongoing symptoms.

## 2015-09-13 DIAGNOSIS — N760 Acute vaginitis: Secondary | ICD-10-CM | POA: Insufficient documentation

## 2015-09-13 DIAGNOSIS — N898 Other specified noninflammatory disorders of vagina: Secondary | ICD-10-CM | POA: Diagnosis present

## 2015-09-13 LAB — POCT PREGNANCY, URINE: PREG TEST UR: NEGATIVE

## 2015-09-13 LAB — URINALYSIS COMPLETE WITH MICROSCOPIC (ARMC ONLY)
BACTERIA UA: NONE SEEN
Bilirubin Urine: NEGATIVE
GLUCOSE, UA: NEGATIVE mg/dL
HGB URINE DIPSTICK: NEGATIVE
Nitrite: NEGATIVE
PH: 5 (ref 5.0–8.0)
Protein, ur: NEGATIVE mg/dL
Specific Gravity, Urine: 1.03 (ref 1.005–1.030)

## 2015-09-13 NOTE — ED Notes (Signed)
Pt reports 2 weeks of vaginal itching, on the outside and slight lower abdominal pain.

## 2015-09-14 ENCOUNTER — Emergency Department
Admission: EM | Admit: 2015-09-14 | Discharge: 2015-09-14 | Disposition: A | Discharge status: Home / Self Care | Payer: 59 | Attending: Emergency Medicine | Admitting: Emergency Medicine

## 2015-09-14 DIAGNOSIS — B9689 Other specified bacterial agents as the cause of diseases classified elsewhere: Secondary | ICD-10-CM

## 2015-09-14 DIAGNOSIS — N760 Acute vaginitis: Secondary | ICD-10-CM

## 2015-09-14 LAB — CHLAMYDIA/NGC RT PCR (ARMC ONLY)
CHLAMYDIA TR: NOT DETECTED
N gonorrhoeae: NOT DETECTED

## 2015-09-14 LAB — WET PREP, GENITAL
Sperm: NONE SEEN
Trich, Wet Prep: NONE SEEN
YEAST WET PREP: NONE SEEN

## 2015-09-14 MED ORDER — METRONIDAZOLE 500 MG PO TABS: 500.0000 mg | ORAL_TABLET | Freq: Two times a day (BID) | ORAL | Status: AC

## 2015-09-14 MED ORDER — METRONIDAZOLE 500 MG PO TABS
500.0000 mg | ORAL_TABLET | Freq: Once | ORAL | Status: AC
  Administered 2015-09-14: 500 mg via ORAL
  Filled 2015-09-14: qty 1

## 2015-09-14 NOTE — ED Provider Notes (Signed)
[facility] Emergency Department Provider Note  Time seen: 1:11 AM  I have reviewed the triage vital signs and the nursing notes.   HISTORY  Chief Complaint Vaginal Itching    HPI Paula Castro is a 25 y.o. female no past medical history who presents the emergency department vaginal itching. According to the patient for the past 2 weeks she has had significant itching to her vaginal area. States it is mostly external. Denies any discharge. Denies abdominal pain other states she was having some lower abdominal pain one week ago. Denies any dysuria, nausea, vomiting, diarrhea. Denies fever. Describes the vaginal itching as moderate to severe.     Past Medical History  Diagnosis Date  . Environmental allergies     Patient Active Problem List   Diagnosis Date Noted  . Hematuria, microscopic 03/09/2011  . Supervision of normal first pregnancy 03/09/2011    Past Surgical History  Procedure Laterality Date  . Tonsillectomy  2004    No current outpatient prescriptions on file.  Allergies Review of patient's allergies indicates no known allergies.  No family history on file.  Social History Social History  Substance Use Topics  . Smoking status: Never Smoker   . Smokeless tobacco: Never Used  . Alcohol Use: No    Review of Systems Constitutional: Negative for fever. Cardiovascular: Negative for chest pain. Respiratory: Negative for shortness of breath. Gastrointestinal: Negative for abdominal pain Genitourinary: Negative for dysuria. Positive for vaginal itching. Neurological: Negative for headache 10-point ROS otherwise negative.  ____________________________________________   PHYSICAL EXAM:  VITAL SIGNS: ED Triage Vitals  Enc Vitals Group     BP 09/13/15 2203 124/76 mmHg     Pulse Rate 09/13/15 2203 76     Resp 09/13/15 2203 18     Temp 09/13/15 2203 98.7 F (37.1 C)     Temp Source 09/13/15 2203 Oral     SpO2 09/13/15 2203  100 %     Weight 09/13/15 2203 175 lb (79.379 kg)     Height 09/13/15 2203  (1.626 m)     Head Cir --      Peak Flow --      Pain Score 09/13/15 2207 8     Pain Loc --      Pain Edu? --      Excl. in GC? --     Constitutional: Alert and oriented. Well appearing and in no distress. Eyes: Normal exam ENT   Head: Normocephalic and atraumatic.   Mouth/Throat: Mucous membranes are moist. Cardiovascular: Normal rate, regular rhythm. No murmur Respiratory: Normal respiratory effort without tachypnea nor retractions. Breath sounds are clear Gastrointestinal: Soft and nontender. No distention.   Musculoskeletal: Nontender with normal range of motion in all extremities. Neurologic:  Normal speech and language. No gross focal neurologic deficits Skin:  Skin is warm, dry and intact.  Psychiatric: Mood and affect are normal. Speech and behavior are normal.   ____________________________________________   INITIAL IMPRESSION / ASSESSMENT AND PLAN / ED COURSE  Pertinent labs & imaging results that were available during my care of the patient were reviewed by me and considered in my medical decision making (see chart for details).  Patient presents with 2 weeks of vaginal itching. Nontender abdominal exam. Urinalysis within normal limits. We will perform a pelvic examination to further evaluate.  Clue cells on wet prep consistent bacterial vaginitis. We'll place on Flagyl. I discussed with the patient STD prophylaxis, she believes she is low risk and  will wait for the results. Patient agreeable plan to follow up with PCP.  ____________________________________________   FINAL CLINICAL IMPRESSION(S) / ED DIAGNOSES  Bacterial vaginitis   Minna Antis, MD 09/14/15 [phone]

## 2015-09-14 NOTE — Discharge Instructions (Signed)

## 2016-02-29 ENCOUNTER — Emergency Department (HOSPITAL_COMMUNITY)
Admission: EM | Admit: 2016-02-29 | Discharge: 2016-02-29 | Disposition: A | Discharge status: Home / Self Care | Payer: 59 | Attending: Emergency Medicine | Admitting: Emergency Medicine

## 2016-02-29 ENCOUNTER — Encounter (HOSPITAL_COMMUNITY): Payer: Self-pay | Admitting: Emergency Medicine

## 2016-02-29 ENCOUNTER — Emergency Department (HOSPITAL_COMMUNITY): Payer: 59

## 2016-02-29 DIAGNOSIS — R002 Palpitations: Secondary | ICD-10-CM | POA: Diagnosis not present

## 2016-02-29 LAB — BASIC METABOLIC PANEL
ANION GAP: 6 (ref 5–15)
BUN: <5 mg/dL — ABNORMAL LOW (ref 6–20)
CALCIUM: 8.9 mg/dL (ref 8.9–10.3)
CO2: 23 mmol/L (ref 22–32)
CREATININE: 0.61 mg/dL (ref 0.44–1.00)
Chloride: 103 mmol/L (ref 101–111)
Glucose, Bld: 102 mg/dL — ABNORMAL HIGH (ref 65–99)
Potassium: 3.3 mmol/L — ABNORMAL LOW (ref 3.5–5.1)
SODIUM: 132 mmol/L — AB (ref 135–145)

## 2016-02-29 LAB — CBC
HCT: 38.5 % (ref 36.0–46.0)
HEMOGLOBIN: 12.7 g/dL (ref 12.0–15.0)
MCH: 30.2 pg (ref 26.0–34.0)
MCHC: 33 g/dL (ref 30.0–36.0)
MCV: 91.4 fL (ref 78.0–100.0)
PLATELETS: 186 10*3/uL (ref 150–400)
RBC: 4.21 MIL/uL (ref 3.87–5.11)
RDW: 12.2 % (ref 11.5–15.5)
WBC: 5 10*3/uL (ref 4.0–10.5)

## 2016-02-29 LAB — I-STAT TROPONIN, ED: TROPONIN I, POC: 0 ng/mL (ref 0.00–0.08)

## 2016-02-29 NOTE — ED Triage Notes (Signed)
Pt here with palpitations that began after taking a vitamin and drinking an energy drink. Pt also reports starting a new diet recently. Pt sts she initially thought it could be related to anxiety, but wanted to be safe. Pt denies CP.

## 2016-02-29 NOTE — ED Notes (Signed)
Pt d/c home  

## 2016-02-29 NOTE — ED Provider Notes (Signed)
[facility]-EMERGENCY DEPT Provider Note   CSN: [id number] Arrival date & time: 02/29/16  1722     History   Chief Complaint Chief Complaint  Patient presents with  . Palpitations    HPI Paula Castro is a 25 y.o. female.  She presents for evaluation of rapid heartbeat which started yesterday after drinking 2 energy drinks. She was unable to sleep the night before, so used to drinks to help stay awake. She reports stress from work, and insomnia related to the stress, for several weeks. He is also trying to lose weight with a diet. She denies fever, chills, nausea, vomiting, cough, shortness of breath or chest pain. There are no other known modifying factors.  HPI  Past Medical History:  Diagnosis Date  . Environmental allergies     Patient Active Problem List   Diagnosis Date Noted  . Hematuria, microscopic 03/09/2011  . Supervision of normal first pregnancy 03/09/2011    Past Surgical History:  Procedure Laterality Date  . TONSILLECTOMY  2004    OB History    Gravida Para Term Preterm AB Living   1 0 0 0 0 0   SAB TAB Ectopic Multiple Live Births   0 0 0 0         Home Medications    Prior to Admission medications   Not on File    Family History History reviewed. No pertinent family history.  Social History Social History  Substance Use Topics  . Smoking status: Never Smoker  . Smokeless tobacco: Never Used  . Alcohol use No     Allergies   Review of patient's allergies indicates no known allergies.   Review of Systems Review of Systems  All other systems reviewed and are negative.    Physical Exam Updated Vital Signs BP 127/76   Pulse 83   Temp 98.7 F (37.1 C)   Resp 20   Wt 190 lb 5 oz (86.3 kg)   LMP 02/15/2016 (Exact Date)   SpO2 100%   BMI 32.67 kg/m   Physical Exam  Constitutional: She is oriented to person, place, and time. She appears well-developed and well-nourished.  HENT:  Head: Normocephalic and atraumatic.  Eyes:  Conjunctivae and EOM are normal. Pupils are equal, round, and reactive to light.  Neck: Normal range of motion and phonation normal. Neck supple.  Cardiovascular: Normal rate and regular rhythm.   Pulmonary/Chest: Effort normal and breath sounds normal. She exhibits no tenderness.  Abdominal: Soft. She exhibits no distension. There is no tenderness. There is no guarding.  Musculoskeletal: Normal range of motion.  Neurological: She is alert and oriented to person, place, and time. She exhibits normal muscle tone.  Skin: Skin is warm and dry.  Psychiatric: She has a normal mood and affect. Her behavior is normal. Judgment and thought content normal.  Nursing note and vitals reviewed.    ED Treatments / Results  Labs (all labs ordered are listed, but only abnormal results are displayed) Labs Reviewed  BASIC METABOLIC PANEL - Abnormal; Notable for the following:       Result Value   Sodium 132 (*)    Potassium 3.3 (*)    Glucose, Bld 102 (*)    BUN <5 (*)    All other components within normal limits  CBC  I-STAT TROPOININ, ED    EKG  EKG Interpretation  Date/Time:  Thursday February 29 2016 17:35:55 EDT Ventricular Rate:  108 PR Interval:  142 QRS Duration: 80 QT Interval:  326 QTC Calculation: 436 R Axis:   88 Text Interpretation:  Sinus tachycardia Otherwise normal ECG No old tracing to compare Confirmed by [facility]  MD, Jagjit Riner [phone]([id number]) on 02/29/2016 8:19:24 PM       Radiology Dg Chest 2 View  Result Date: 02/29/2016 CLINICAL DATA:  Shortness of breath. EXAM: CHEST  2 VIEW COMPARISON:  None. FINDINGS: The heart size and mediastinal contours are within normal limits. Both lungs are clear. The visualized skeletal structures are unremarkable. IMPRESSION: No active cardiopulmonary disease. Electronically Signed   By: Gerome Samavid  Williams III M.D   On: 02/29/2016 18:06    Procedures Procedures (including critical care time)  Medications Ordered in ED Medications - No data to  display   Initial Impression / Assessment and Plan / ED Course  I have reviewed the triage vital signs and the nursing notes.  Pertinent labs & imaging results that were available during my care of the patient were reviewed by me and considered in my medical decision making (see chart for details).  Clinical Course    Medications - No data to display  Patient Vitals for the past 24 hrs:  BP Temp Pulse Resp SpO2 Weight  02/29/16 2030 127/76 - 83 20 100 % -  02/29/16 2015 - - 74 23 100 % -  02/29/16 2008 - - - - 98 % -  02/29/16 1735 - - - - - 190 lb 5 oz (86.3 kg)  02/29/16 1734 146/84 98.7 F (37.1 C) 103 18 100 % -    8:52 PM Reevaluation with update and discussion. After initial assessment and treatment, an updated evaluation reveals No further complaints. Findings discussed with the patient and all questions answered. Nishant Schrecengost L    Final Clinical Impressions(s) / ED Diagnoses   Final diagnoses:  Palpitations    Nonspecific symptoms with reassuring evaluation. Patient is under stress , which is probably the primary causative factor. She was encouraged to work on that, and to eat regularly.  Nursing Notes Reviewed/ Care Coordinated Applicable Imaging Reviewed Interpretation of Laboratory Data incorporated into ED treatment  The patient appears reasonably screened and/or stabilized for discharge and I doubt any other medical condition or other [facility] requiring further screening, evaluation, or treatment in the ED at this time prior to discharge.  Plan: Home Medications- continue; Home Treatments- rest; return here if the recommended treatment, does not improve the symptoms; Recommended follow up- PCP prn   New Prescriptions New Prescriptions   No medications on file     Mancel BaleElliott Shyann Hefner, MD 02/29/16 2053

## 2016-03-29 DIAGNOSIS — R21 Rash and other nonspecific skin eruption: Secondary | ICD-10-CM | POA: Diagnosis present

## 2016-03-29 DIAGNOSIS — Z5321 Procedure and treatment not carried out due to patient leaving prior to being seen by health care provider: Secondary | ICD-10-CM | POA: Diagnosis not present

## 2016-03-30 ENCOUNTER — Encounter: Payer: Self-pay | Admitting: Urgent Care

## 2016-03-30 ENCOUNTER — Emergency Department
Admission: EM | Admit: 2016-03-30 | Discharge: 2016-03-30 | Disposition: A | Discharge status: Home / Self Care | Payer: 59 | Attending: Emergency Medicine | Admitting: Emergency Medicine

## 2016-03-30 NOTE — ED Triage Notes (Signed)
Patient presents to the ED from home. Patient with c/o a rash to her groin x 1 week; (+) vaginal irritation. Denies urinary symptoms and discharge.

## 2016-03-30 NOTE — ED Notes (Signed)
Pt called x 1 in waiting room.  No answer.

## 2016-06-15 ENCOUNTER — Emergency Department (HOSPITAL_COMMUNITY)
Admission: EM | Admit: 2016-06-15 | Discharge: 2016-06-15 | Disposition: A | Discharge status: Home / Self Care | Payer: 59 | Attending: Emergency Medicine | Admitting: Emergency Medicine

## 2016-06-15 ENCOUNTER — Encounter (HOSPITAL_COMMUNITY): Payer: Self-pay

## 2016-06-15 DIAGNOSIS — R110 Nausea: Secondary | ICD-10-CM | POA: Diagnosis present

## 2016-06-15 DIAGNOSIS — K2900 Acute gastritis without bleeding: Secondary | ICD-10-CM | POA: Diagnosis not present

## 2016-06-15 LAB — CBC WITH DIFFERENTIAL/PLATELET
BASOS ABS: 0 10*3/uL (ref 0.0–0.1)
Basophils Relative: 1 %
Eosinophils Absolute: 0 10*3/uL (ref 0.0–0.7)
Eosinophils Relative: 1 %
HEMATOCRIT: 37.8 % (ref 36.0–46.0)
Hemoglobin: 12.8 g/dL (ref 12.0–15.0)
LYMPHS ABS: 2.5 10*3/uL (ref 0.7–4.0)
LYMPHS PCT: 37 %
MCH: 30.6 pg (ref 26.0–34.0)
MCHC: 33.9 g/dL (ref 30.0–36.0)
MCV: 90.4 fL (ref 78.0–100.0)
MONO ABS: 0.5 10*3/uL (ref 0.1–1.0)
MONOS PCT: 7 %
NEUTROS ABS: 3.7 10*3/uL (ref 1.7–7.7)
Neutrophils Relative %: 54 %
Platelets: 217 10*3/uL (ref 150–400)
RBC: 4.18 MIL/uL (ref 3.87–5.11)
RDW: 12.4 % (ref 11.5–15.5)
WBC: 6.8 10*3/uL (ref 4.0–10.5)

## 2016-06-15 LAB — BASIC METABOLIC PANEL
ANION GAP: 7 (ref 5–15)
BUN: 7 mg/dL (ref 6–20)
CALCIUM: 9.2 mg/dL (ref 8.9–10.3)
CO2: 26 mmol/L (ref 22–32)
Chloride: 106 mmol/L (ref 101–111)
Creatinine, Ser: 0.88 mg/dL (ref 0.44–1.00)
GFR calc Af Amer: >60 mL/min (ref >60)
GFR calc non Af Amer: >60 mL/min (ref >60)
GLUCOSE: 115 mg/dL — AB (ref 65–99)
Potassium: 3.5 mmol/L (ref 3.5–5.1)
Sodium: 139 mmol/L (ref 135–145)

## 2016-06-15 LAB — LIPASE, BLOOD: Lipase: 22 U/L (ref 11–51)

## 2016-06-15 LAB — I-STAT BETA HCG BLOOD, ED (MC, WL, AP ONLY): I-stat hCG, quantitative: <5 m[IU]/mL (ref <5)

## 2016-06-15 MED ORDER — OMEPRAZOLE 20 MG PO CPDR
20.0000 mg | DELAYED_RELEASE_CAPSULE | Freq: Two times a day (BID) | ORAL | 0 refills | Status: DC
Start: 2016-06-15 — End: 2016-12-12

## 2016-06-15 MED ORDER — ONDANSETRON 4 MG PO TBDP
4.0000 mg | ORAL_TABLET | Freq: Once | ORAL | Status: AC
  Administered 2016-06-15: 4 mg via ORAL
  Filled 2016-06-15: qty 1

## 2016-06-15 MED ORDER — ONDANSETRON 4 MG PO TBDP: 4.0000 mg | ORAL_TABLET | Freq: Three times a day (TID) | ORAL | 0 refills | Status: DC | PRN

## 2016-06-15 MED ORDER — PANTOPRAZOLE SODIUM 40 MG PO TBEC
40.0000 mg | DELAYED_RELEASE_TABLET | Freq: Every day | ORAL | Status: DC
  Administered 2016-06-15: 40 mg via ORAL
  Filled 2016-06-15: qty 1

## 2016-06-15 NOTE — Discharge Instructions (Signed)
Avoid caffeine, alcohol, tobacco, and anti-inflammatory medicine such as aspirin or Motrin.  Take Prilosec as prescribed twice per day until your symptoms improved.

## 2016-06-15 NOTE — ED Triage Notes (Signed)
Patient complains of lower abdominal/pelvic pain for 1 week with nausea. States she has irregular periods and thinks she may be pregnant. States has 1 positive and 1 negative test at home, NAD

## 2016-06-15 NOTE — ED Provider Notes (Signed)
[facility]-EMERGENCY DEPT Provider Note   CSN: [id number] Arrival date & time: 06/15/16  1452     History   Chief Complaint Chief Complaint  Patient presents with  . nausea, preg test    HPI Paula Castro is a 25 y.o. female. She presents for evaluation of 2 complaints. One is that she is concerned she may be pregnant. She says some nausea over the last few days. She had home pregnancy tests, one positive, 1 negative. She also states that when she drinks coffee or soda it makes her stomach hurt not any vomiting. Tiny black stools. Not weak. No significant abdominal pain. No GU complaints. Is not on birth control. Last was her period 11/16.  HPI  Past Medical History:  Diagnosis Date  . Environmental allergies     Patient Active Problem List   Diagnosis Date Noted  . Hematuria, microscopic 03/09/2011  . Supervision of normal first pregnancy 03/09/2011    Past Surgical History:  Procedure Laterality Date  . TONSILLECTOMY  2004    OB History    Gravida Para Term Preterm AB Living   1 0 0 0 0 0   SAB TAB Ectopic Multiple Live Births   0 0 0 0         Home Medications    Prior to Admission medications   Medication Sig Start Date End Date Taking? Authorizing Provider  omeprazole (PRILOSEC) 20 MG capsule Take 1 capsule (20 mg total) by mouth 2 (two) times daily. 06/15/16   Rolland Porter, MD  ondansetron (ZOFRAN ODT) 4 MG disintegrating tablet Take 1 tablet (4 mg total) by mouth every 8 (eight) hours as needed for nausea. 06/15/16   Rolland Porter, MD    Family History No family history on file.  Social History Social History  Substance Use Topics  . Smoking status: Never Smoker  . Smokeless tobacco: Never Used  . Alcohol use No     Allergies   Patient has no known allergies.   Review of Systems Review of Systems  Constitutional: Negative for appetite change, chills, diaphoresis, fatigue and fever.  HENT: Negative for mouth sores, sore throat and trouble swallowing.    Eyes: Negative for visual disturbance.  Respiratory: Negative for cough, chest tightness, shortness of breath and wheezing.   Cardiovascular: Negative for chest pain.  Gastrointestinal: Positive for nausea. Negative for abdominal distention, abdominal pain, diarrhea and vomiting.  Endocrine: Negative for polydipsia, polyphagia and polyuria.  Genitourinary: Negative for dysuria, frequency and hematuria.  Musculoskeletal: Negative for gait problem.  Skin: Negative for color change, pallor and rash.  Neurological: Negative for dizziness, syncope, light-headedness and headaches.  Hematological: Does not bruise/bleed easily.  Psychiatric/Behavioral: Negative for behavioral problems and confusion.     Physical Exam Updated Vital Signs BP 129/83 (BP Location: Right Arm)   Pulse 96   Temp 98 F (36.7 C) (Oral)   Resp 16   SpO2 100%   Physical Exam  Constitutional: She is oriented to person, place, and time. She appears well-developed and well-nourished. No distress.  HENT:  Head: Normocephalic.  Eyes: Conjunctivae are normal. Pupils are equal, round, and reactive to light. No scleral icterus.  Neck: Normal range of motion. Neck supple. No thyromegaly present.  Cardiovascular: Normal rate and regular rhythm.  Exam reveals no gallop and no friction rub.   No murmur heard. Pulmonary/Chest: Effort normal and breath sounds normal. No respiratory distress. She has no wheezes. She has no rales.  Abdominal: Soft. Bowel sounds are  normal. She exhibits no distension. There is no tenderness. There is no rebound.  Soft benign abdomen. No suprapubic tenderness. No epigastric tenderness.  Musculoskeletal: Normal range of motion.  Neurological: She is alert and oriented to person, place, and time.  Skin: Skin is warm and dry. No rash noted.  Psychiatric: She has a normal mood and affect. Her behavior is normal.     ED Treatments / Results  Labs (all labs ordered are listed, but only abnormal  results are displayed) Labs Reviewed  BASIC METABOLIC PANEL - Abnormal; Notable for the following:       Result Value   Glucose, Bld 115 (*)    All other components within normal limits  CBC WITH DIFFERENTIAL/PLATELET  LIPASE, BLOOD  I-STAT BETA HCG BLOOD, ED ([facility], [facility], [facility] ONLY)    EKG  EKG Interpretation None       Radiology No results found.  Procedures Procedures (including critical care time)  Medications Ordered in ED Medications  pantoprazole (PROTONIX) EC tablet 40 mg (40 mg Oral Given 06/15/16 1712)  ondansetron (ZOFRAN-ODT) disintegrating tablet 4 mg (4 mg Oral Given 06/15/16 1713)     Initial Impression / Assessment and Plan / ED Course  I have reviewed the triage vital signs and the nursing notes.  Pertinent labs & imaging results that were available during my care of the patient were reviewed by me and considered in my medical decision making (see chart for details).  Clinical Course     Beta patency test. Remainder of her labs are normal. Symptoms are consistent with gastritis. She consumes coffee and soda almost constantly. She is concerned about STDs. She declines pelvic exam.  Discharge home. Avoid alcohol, tobacco,, temperature Prilosec when necessary. Zofran when necessary. Primary care follow-up.  Final Clinical Impressions(s) / ED Diagnoses   Final diagnoses:  Acute gastritis without hemorrhage, unspecified gastritis type  Nausea    New Prescriptions New Prescriptions   OMEPRAZOLE (PRILOSEC) 20 MG CAPSULE    Take 1 capsule (20 mg total) by mouth 2 (two) times daily.   ONDANSETRON (ZOFRAN ODT) 4 MG DISINTEGRATING TABLET    Take 1 tablet (4 mg total) by mouth every 8 (eight) hours as needed for nausea.     Rolland PorterMark Hiral Lukasiewicz, MD 06/15/16 [phone]

## 2016-06-28 ENCOUNTER — Encounter: Payer: Self-pay | Admitting: Emergency Medicine

## 2016-06-28 ENCOUNTER — Emergency Department
Admission: EM | Admit: 2016-06-28 | Discharge: 2016-06-28 | Disposition: A | Discharge status: Home / Self Care | Payer: 59 | Attending: Emergency Medicine | Admitting: Emergency Medicine

## 2016-06-28 DIAGNOSIS — R0981 Nasal congestion: Secondary | ICD-10-CM | POA: Diagnosis present

## 2016-06-28 DIAGNOSIS — Z5321 Procedure and treatment not carried out due to patient leaving prior to being seen by health care provider: Secondary | ICD-10-CM | POA: Diagnosis not present

## 2016-06-28 NOTE — ED Triage Notes (Signed)
Pt in with co cold symptoms, runny nose, congestion, bilat earaches.

## 2016-07-08 NOTE — L&D Delivery Note (Signed)
Dr. Jean Rosenthal present to evaluate pt at this time.  Plan of care discussed and pt agrees with plan of care

## 2016-07-08 NOTE — L&D Delivery Note (Signed)
Dr. Jean Rosenthal notified by L. Elks RN.  Will evaluate pt

## 2016-07-13 ENCOUNTER — Encounter: Payer: Self-pay | Admitting: *Deleted

## 2016-07-13 ENCOUNTER — Emergency Department
Admission: EM | Admit: 2016-07-13 | Discharge: 2016-07-13 | Disposition: A | Discharge status: Home / Self Care | Payer: 59 | Attending: Emergency Medicine | Admitting: Emergency Medicine

## 2016-07-13 DIAGNOSIS — Z3A01 Less than 8 weeks gestation of pregnancy: Secondary | ICD-10-CM | POA: Insufficient documentation

## 2016-07-13 DIAGNOSIS — O9989 Other specified diseases and conditions complicating pregnancy, childbirth and the puerperium: Secondary | ICD-10-CM | POA: Insufficient documentation

## 2016-07-13 DIAGNOSIS — H65191 Other acute nonsuppurative otitis media, right ear: Secondary | ICD-10-CM | POA: Insufficient documentation

## 2016-07-13 DIAGNOSIS — Z79899 Other long term (current) drug therapy: Secondary | ICD-10-CM | POA: Diagnosis not present

## 2016-07-13 NOTE — ED Provider Notes (Signed)
[facility] Emergency Department Provider Note  ____________________________________________   First MD Initiated Contact with Patient 07/13/16 [phone]     (approximate)  I have reviewed the triage vital signs and the nursing notes.   HISTORY  Chief Complaint Otalgia    HPI Paula Castro is a 26 y.o. female who reports that she is [redacted] weeks pregnant but has no chronic medical issues presents for evaluation of acute onset right ear pain.  She states that it started yesterday and has been aching mild pain throughout the course of the day.  She states it feels like there is some fluid in her ear with a sensation of fullness.  Nothing makes it better or worse.  She has no accompanying symptoms.  She denies fever/chills, chest pain, shortness of breath, nausea, vomiting, nasal congestion, runny nose, sore throat, abdominal pain, dysuria.   Past Medical History:  Diagnosis Date  . Environmental allergies     Patient Active Problem List   Diagnosis Date Noted  . Hematuria, microscopic 03/09/2011  . Supervision of normal first pregnancy 03/09/2011    Past Surgical History:  Procedure Laterality Date  . TONSILLECTOMY  2004    Prior to Admission medications   Medication Sig Start Date End Date Taking? Authorizing Provider  Prenatal Vit-Fe Fumarate-FA (PRENATAL VITAMIN PO) Take 1 tablet by mouth.   Yes Historical Provider, MD  omeprazole (PRILOSEC) 20 MG capsule Take 1 capsule (20 mg total) by mouth 2 (two) times daily. 06/15/16   Rolland Porter, MD  ondansetron (ZOFRAN ODT) 4 MG disintegrating tablet Take 1 tablet (4 mg total) by mouth every 8 (eight) hours as needed for nausea. 06/15/16   Rolland Porter, MD    Allergies Patient has no known allergies.  History reviewed. No pertinent family history.  Social History Social History  Substance Use Topics  . Smoking status: Never Smoker  . Smokeless tobacco: Never Used  . Alcohol use No    Review of  Systems Constitutional: No fever/chills Eyes: No visual changes. ENT: No sore throat. Pain/fullness in her right ear Cardiovascular: Denies chest pain. Respiratory: Denies shortness of breath. Gastrointestinal: No abdominal pain.  No nausea, no vomiting.  No diarrhea.  No constipation. Genitourinary: Negative for dysuria. Musculoskeletal: Negative for back pain. Skin: Negative for rash. Neurological: Negative for headaches, focal weakness or numbness.  10-point ROS otherwise negative.  ____________________________________________   PHYSICAL EXAM:  VITAL SIGNS: ED Triage Vitals  Enc Vitals Group     BP 07/13/16 0258 100/78     Pulse Rate 07/13/16 0258 71     Resp 07/13/16 0258 20     Temp 07/13/16 0258 98.2 F (36.8 C)     Temp Source 07/13/16 0258 Oral     SpO2 07/13/16 0258 99 %     Weight 07/13/16 0301 190 lb (86.2 kg)     Height 07/13/16 0301 5\' 4"  (1.626 m)     Head Circumference --      Peak Flow --      Pain Score 07/13/16 0301 8     Pain Loc --      Pain Edu? --      Excl. in GC? --     Constitutional: Alert and oriented. Well appearing and in no acute distress. Eyes: Conjunctivae are normal. PERRL. EOMI. Head: Atraumatic. Ears:  Healthy appearing ear canals bilaterally.  Left TM is normal-appearing.  Right TM may have a small effusion but does not appear consistent with otitis media.  Nose: No congestion/rhinnorhea. Cardiovascular: Normal rate, regular rhythm. Good peripheral circulation. Grossly normal heart sounds. Respiratory: Normal respiratory effort.  No retractions. Lungs CTAB. Gastrointestinal: Soft and nontender. No distention.  Musculoskeletal: No lower extremity tenderness nor edema. No gross deformities of extremities. Neurologic:  Normal speech and language. No gross focal neurologic deficits are appreciated.  Skin:  Skin is warm, dry and intact. No rash noted. Psychiatric: Mood and affect are normal. Speech and behavior are  normal.  ____________________________________________   LABS (all labs ordered are listed, but only abnormal results are displayed)  Labs Reviewed - No data to display ____________________________________________  EKG  None - EKG not ordered by ED physician ____________________________________________  RADIOLOGY   No results found.  ____________________________________________   PROCEDURES  Procedure(s) performed:   Procedures   Critical Care performed: No ____________________________________________   INITIAL IMPRESSION / ASSESSMENT AND PLAN / ED COURSE  Pertinent labs & imaging results that were available during my care of the patient were reviewed by me and considered in my medical decision making (see chart for details).  Well-appearing, no acute distress, normal vitals.  She has what appears to be a small effusion behind her right TM but without any evidence of infection.  I suspect she has a mild viral illness.  I explained that I do not think antibiotics are needed at this time.  I encouraged the use of over-the-counter ibuprofen and Tylenol and encourage outpatient follow-up.  I gave my usual and customary return precautions.      ____________________________________________  FINAL CLINICAL IMPRESSION(S) / ED DIAGNOSES  Final diagnoses:  Acute effusion of right ear     MEDICATIONS GIVEN DURING THIS VISIT:  Medications - No data to display   NEW OUTPATIENT MEDICATIONS STARTED DURING THIS VISIT:  New Prescriptions   No medications on file    Modified Medications   No medications on file    Discontinued Medications   No medications on file     Note:  This document was prepared using Dragon voice recognition software and may include unintentional dictation errors.    Loleta Rose, MD 07/13/16 [phone]

## 2016-07-13 NOTE — Discharge Instructions (Signed)
You have been seen in the Emergency Department (ED) today for ear pain that we believe is due to some fluid.  There is no evidence of a bacterial infection requiring antibiotics.  Please follow up with your doctor as listed above.  Call your doctor or return to the Emergency Department (ED) if you are unable to tolerate fluids due to vomiting, have worsening trouble breathing, become extremely tired or difficult to awaken, or if you develop any other symptoms that concern you.

## 2016-07-13 NOTE — ED Triage Notes (Signed)
Pt c/o R ear pain starting yesterday. Pt denies other sxs. Pt is pregnant and has taken nothing for pain. Educated regarding taking tylenol for her pain.

## 2016-07-16 LAB — OB RESULTS CONSOLE RUBELLA ANTIBODY, IGM: RUBELLA: IMMUNE

## 2016-07-16 LAB — OB RESULTS CONSOLE VARICELLA ZOSTER ANTIBODY, IGG: Varicella: IMMUNE

## 2016-07-16 LAB — OB RESULTS CONSOLE GC/CHLAMYDIA
Chlamydia: NEGATIVE
GC PROBE AMP, GENITAL: NEGATIVE

## 2016-07-16 LAB — OB RESULTS CONSOLE HGB/HCT, BLOOD
HCT: 38
Hemoglobin: 12.6

## 2016-07-16 LAB — OB RESULTS CONSOLE ABO/RH: RH TYPE: POSITIVE

## 2016-07-16 LAB — OB RESULTS CONSOLE RPR: RPR: NONREACTIVE

## 2016-07-16 LAB — OB RESULTS CONSOLE HEPATITIS B SURFACE ANTIGEN: HEP B S AG: NEGATIVE

## 2016-07-16 LAB — OB RESULTS CONSOLE PLATELET COUNT: PLATELETS: 216

## 2016-07-16 LAB — OB RESULTS CONSOLE HIV ANTIBODY (ROUTINE TESTING): HIV: NONREACTIVE

## 2016-09-01 ENCOUNTER — Emergency Department
Admission: EM | Admit: 2016-09-01 | Discharge: 2016-09-01 | Disposition: A | Discharge status: Home / Self Care | Payer: 59 | Attending: Emergency Medicine | Admitting: Emergency Medicine

## 2016-09-01 DIAGNOSIS — N390 Urinary tract infection, site not specified: Secondary | ICD-10-CM

## 2016-09-01 DIAGNOSIS — Z79899 Other long term (current) drug therapy: Secondary | ICD-10-CM | POA: Insufficient documentation

## 2016-09-01 DIAGNOSIS — O23591 Infection of other part of genital tract in pregnancy, first trimester: Secondary | ICD-10-CM | POA: Insufficient documentation

## 2016-09-01 DIAGNOSIS — Z3A14 14 weeks gestation of pregnancy: Secondary | ICD-10-CM | POA: Insufficient documentation

## 2016-09-01 DIAGNOSIS — B379 Candidiasis, unspecified: Secondary | ICD-10-CM

## 2016-09-01 DIAGNOSIS — O2341 Unspecified infection of urinary tract in pregnancy, first trimester: Secondary | ICD-10-CM | POA: Insufficient documentation

## 2016-09-01 LAB — WET PREP, GENITAL
CLUE CELLS WET PREP: NONE SEEN
Trich, Wet Prep: NONE SEEN

## 2016-09-01 LAB — URINALYSIS, COMPLETE (UACMP) WITH MICROSCOPIC
Bacteria, UA: NONE SEEN
Bilirubin Urine: NEGATIVE
Glucose, UA: NEGATIVE mg/dL
Hgb urine dipstick: NEGATIVE
Ketones, ur: NEGATIVE mg/dL
Nitrite: NEGATIVE
Protein, ur: NEGATIVE mg/dL
Specific Gravity, Urine: 1.017 (ref 1.005–1.030)
pH: 5 (ref 5.0–8.0)

## 2016-09-01 LAB — CHLAMYDIA/NGC RT PCR (ARMC ONLY)

## 2016-09-01 MED ORDER — NITROFURANTOIN MONOHYD MACRO 100 MG PO CAPS: 100.0000 mg | ORAL_CAPSULE | Freq: Two times a day (BID) | ORAL | 0 refills | Status: AC

## 2016-09-01 MED ORDER — CEPHALEXIN 500 MG PO CAPS
500.0000 mg | ORAL_CAPSULE | Freq: Once | ORAL | Status: AC
  Administered 2016-09-01: 500 mg via ORAL

## 2016-09-01 MED ORDER — CEPHALEXIN 500 MG PO CAPS
ORAL_CAPSULE | ORAL | Status: AC
  Administered 2016-09-01: 500 mg via ORAL
  Filled 2016-09-01: qty 1

## 2016-09-01 NOTE — ED Notes (Signed)
ED Provider at bedside. 

## 2016-09-01 NOTE — ED Provider Notes (Signed)
[facility] Emergency Department Provider Note  ____________________________________________   First MD Initiated Contact with Patient 09/01/16 0730     (approximate)  I have reviewed the triage vital signs and the nursing notes.   HISTORY  Chief Complaint Urinary Frequency   HPI Paula Castro is a 26 y.o. female who is a G2 at [redacted] weeks gestationpresenting to the emergency department with burning and frequency upon urination. She denies any vaginal bleeding or discharge. Says that she was just tested for STDs in January including gonorrhea and chlamydia and they were negative. She is following up at [facility] for her prenatal care and is on prenatal vitamins at this time. She denies any abdominal pain, vaginal bleeding or discharge. She does say that her last pregnancy was calm. By preeclampsia. When she is not pregnant she does not have high blood pressure and is not taking any medications on regular basis.   Past Medical History:  Diagnosis Date  . Environmental allergies     Patient Active Problem List   Diagnosis Date Noted  . Hematuria, microscopic 03/09/2011  . Supervision of normal first pregnancy 03/09/2011    Past Surgical History:  Procedure Laterality Date  . TONSILLECTOMY  2004    Prior to Admission medications   Medication Sig Start Date End Date Taking? Authorizing Provider  omeprazole (PRILOSEC) 20 MG capsule Take 1 capsule (20 mg total) by mouth 2 (two) times daily. 06/15/16   Rolland Porter, MD  ondansetron (ZOFRAN ODT) 4 MG disintegrating tablet Take 1 tablet (4 mg total) by mouth every 8 (eight) hours as needed for nausea. 06/15/16   Rolland Porter, MD  Prenatal Vit-Fe Fumarate-FA (PRENATAL VITAMIN PO) Take 1 tablet by mouth.    Historical Provider, MD    Allergies Patient has no known allergies.  No family history on file.  Social History Social History  Substance Use Topics  . Smoking status: Never Smoker  . Smokeless tobacco:  Never Used  . Alcohol use No    Review of Systems Constitutional: No fever/chills Eyes: No visual changes. ENT: No sore throat. Cardiovascular: Denies chest pain. Respiratory: Denies shortness of breath. Gastrointestinal: No abdominal pain.  No nausea, no vomiting.  No diarrhea.  No constipation. Genitourinary: as above Musculoskeletal: Negative for back pain. Skin: Negative for rash. Neurological: Negative for headaches, focal weakness or numbness.  10-point ROS otherwise negative.  ____________________________________________   PHYSICAL EXAM:  VITAL SIGNS: ED Triage Vitals [09/01/16 0723]  Enc Vitals Group     BP (!) 140/94     Pulse Rate (!) 104     Resp 18     Temp 98.3 F (36.8 C)     Temp Source Oral     SpO2 100 %     Weight 200 lb (90.7 kg)     Height 5\' 4"  (1.626 m)     Head Circumference      Peak Flow      Pain Score 5     Pain Loc      Pain Edu?      Excl. in GC?     Constitutional: Alert and oriented. Well appearing and in no acute distress. Eyes: Conjunctivae are normal. PERRL. EOMI. Head: Atraumatic. Nose: No congestion/rhinnorhea. Mouth/Throat: Mucous membranes are moist.   Neck: No stridor.   Cardiovascular: Normal rate, regular rhythm. Grossly normal heart sounds.   Respiratory: Normal respiratory effort.  No retractions. Lungs CTAB. Gastrointestinal: Soft and nontender. No distention.  Musculoskeletal: No lower  extremity tenderness nor edema.  No joint effusions. Neurologic:  Normal speech and language. No gross focal neurologic deficits are appreciated.  Skin:  Skin is warm, dry and intact. No rash noted. Psychiatric: Mood and affect are normal. Speech and behavior are normal.  ____________________________________________   LABS (all labs ordered are listed, but only abnormal results are displayed)  Labs Reviewed  WET PREP, GENITAL - Abnormal; Notable for the following:       Result Value   Yeast Wet Prep HPF POC PRESENT (*)     WBC, Wet Prep HPF POC MANY (*)    All other components within normal limits  URINALYSIS, COMPLETE (UACMP) WITH MICROSCOPIC - Abnormal; Notable for the following:    Color, Urine YELLOW (*)    APPearance CLEAR (*)    Leukocytes, UA TRACE (*)    Squamous Epithelial / LPF 0-5 (*)    All other components within normal limits  CHLAMYDIA/NGC RT PCR ([facility] ONLY)   ____________________________________________  EKG   ____________________________________________  RADIOLOGY   ____________________________________________   PROCEDURES  Procedure(s) performed:   Procedures  Critical Care performed:   ____________________________________________   INITIAL IMPRESSION / ASSESSMENT AND PLAN / ED COURSE  Pertinent labs & imaging results that were available during my care of the patient were reviewed by me and considered in my medical decision making (see chart for details).  ----------------------------------------- 11:17 AM on 09/01/2016 ----------------------------------------- Patient's last blood pressure in the 130s over 80s. Discussed the case with Dr. Jean Rosenthal of OB/GYN who recommends follow-up in not starting blood pressure medication at this time. Patient found to have a yeast infection and possibly mild UTI. We will start on Keflex. She says that she only took one dose of her Monistat which started last time. I counseled her to take all doses over the course of 7 days. She is willing to comply with this plan. She'll be discharged for follow-up with her OB/GYN at [facility]. She is understanding the plan and willing to comply.       ____________________________________________   FINAL CLINICAL IMPRESSION(S) / ED DIAGNOSES  Vaginal candidiasis. UTI.    NEW MEDICATIONS STARTED DURING THIS VISIT:  New Prescriptions   No medications on file     Note:  This document was prepared using Dragon voice recognition software and may include unintentional dictation  errors.    [name], MD 09/01/16 [phone]

## 2016-09-01 NOTE — ED Notes (Signed)
Waiting on pelvic swab results. NAD.

## 2016-09-01 NOTE — ED Triage Notes (Signed)
Pt c/o painful urination for the past 3 weeks, states she thought it was a yeast infection.. Pt states she is [redacted] weeks pregnant, denies any bleeding or other sx.Marland Kitchen

## 2016-09-01 NOTE — ED Notes (Signed)
Waiting on urine results.

## 2016-09-02 ENCOUNTER — Telehealth: Payer: Self-pay | Admitting: Emergency Medicine

## 2016-09-02 LAB — URINE CULTURE

## 2016-09-16 ENCOUNTER — Encounter: Payer: Self-pay | Admitting: Emergency Medicine

## 2016-09-16 DIAGNOSIS — Z5321 Procedure and treatment not carried out due to patient leaving prior to being seen by health care provider: Secondary | ICD-10-CM | POA: Diagnosis not present

## 2016-09-16 DIAGNOSIS — Z3A17 17 weeks gestation of pregnancy: Secondary | ICD-10-CM | POA: Insufficient documentation

## 2016-09-16 DIAGNOSIS — O209 Hemorrhage in early pregnancy, unspecified: Secondary | ICD-10-CM | POA: Diagnosis present

## 2016-09-16 LAB — ABO/RH: ABO/RH(D): O POS

## 2016-09-16 NOTE — ED Triage Notes (Addendum)
Patient to ER for c/o possible spotting (vaginal bleeding). Patient states she had BM, wiped and saw blood. Patient is currently [redacted] weeks pregnant, sees [facility] for OBGYN care. Denies any urinary symptoms.

## 2016-09-17 ENCOUNTER — Emergency Department
Admission: EM | Admit: 2016-09-17 | Discharge: 2016-09-17 | Discharge status: Against Medical Advice | Payer: 59 | Attending: Emergency Medicine | Admitting: Emergency Medicine

## 2016-09-17 HISTORY — DX: Unspecified pre-eclampsia, unspecified trimester: O14.90

## 2016-09-17 LAB — HCG, QUANTITATIVE, PREGNANCY: HCG, BETA CHAIN, QUANT, S: 73554 m[IU]/mL — AB (ref <5)

## 2016-09-23 ENCOUNTER — Encounter (HOSPITAL_COMMUNITY): Payer: Self-pay | Admitting: *Deleted

## 2016-09-23 ENCOUNTER — Inpatient Hospital Stay (HOSPITAL_COMMUNITY)
Admission: AD | Admit: 2016-09-23 | Discharge: 2016-09-23 | Disposition: A | Discharge status: Home / Self Care | Payer: 59 | Source: Ambulatory Visit | Attending: Obstetrics and Gynecology | Admitting: Obstetrics and Gynecology

## 2016-09-23 DIAGNOSIS — Z7982 Long term (current) use of aspirin: Secondary | ICD-10-CM | POA: Diagnosis not present

## 2016-09-23 DIAGNOSIS — R1032 Left lower quadrant pain: Secondary | ICD-10-CM | POA: Diagnosis present

## 2016-09-23 DIAGNOSIS — O98812 Other maternal infectious and parasitic diseases complicating pregnancy, second trimester: Secondary | ICD-10-CM | POA: Insufficient documentation

## 2016-09-23 DIAGNOSIS — B372 Candidiasis of skin and nail: Secondary | ICD-10-CM

## 2016-09-23 DIAGNOSIS — B373 Candidiasis of vulva and vagina: Secondary | ICD-10-CM | POA: Insufficient documentation

## 2016-09-23 DIAGNOSIS — Z3A17 17 weeks gestation of pregnancy: Secondary | ICD-10-CM | POA: Insufficient documentation

## 2016-09-23 DIAGNOSIS — N949 Unspecified condition associated with female genital organs and menstrual cycle: Secondary | ICD-10-CM

## 2016-09-23 LAB — URINALYSIS, ROUTINE W REFLEX MICROSCOPIC
Bilirubin Urine: NEGATIVE
Glucose, UA: NEGATIVE mg/dL
HGB URINE DIPSTICK: NEGATIVE
Ketones, ur: 80 mg/dL — AB
LEUKOCYTES UA: NEGATIVE
NITRITE: NEGATIVE
PROTEIN: NEGATIVE mg/dL
Specific Gravity, Urine: 1.024 (ref 1.005–1.030)
pH: 6 (ref 5.0–8.0)

## 2016-09-23 LAB — WET PREP, GENITAL
CLUE CELLS WET PREP: NONE SEEN
SPERM: NONE SEEN
TRICH WET PREP: NONE SEEN
YEAST WET PREP: NONE SEEN

## 2016-09-23 MED ORDER — NYSTATIN 100000 UNIT/GM EX CREA: TOPICAL_CREAM | CUTANEOUS | 0 refills | Status: DC

## 2016-09-23 MED ORDER — TRIAMCINOLONE ACETONIDE 0.1 % EX CREA: 1.0000 "application " | TOPICAL_CREAM | Freq: Two times a day (BID) | CUTANEOUS | 0 refills | Status: DC

## 2016-09-23 NOTE — Discharge Instructions (Signed)
Skin Yeast Infection Skin yeast infection is a condition in which there is an overgrowth of yeast (candida) that normally lives on the skin. This condition usually occurs in areas of the skin that are constantly warm and moist, such as the armpits or the groin. What are the causes? This condition is caused by a change in the normal balance of the yeast and bacteria that live on the skin. What increases the risk? This condition is more likely to develop in:  People who are obese.  Pregnant women.  Women who take birth control pills.  People who have diabetes.  People who take antibiotic medicines.  People who take steroid medicines.  People who are malnourished.  People who have a weak defense (immune) system.  People who are 65 years of age or older. What are the signs or symptoms? Symptoms of this condition include:  A red, swollen area of the skin.  Bumps on the skin.  Itchiness. How is this diagnosed? This condition is diagnosed with a medical history and physical exam. Your health care provider may check for yeast by taking light scrapings of the skin to be viewed under a microscope. How is this treated? This condition is treated with medicine. Medicines may be prescribed or be available over-the-counter. The medicines may be:  Taken by mouth (orally).  Applied as a cream. Follow these instructions at home:  Take or apply over-the-counter and prescription medicines only as told by your health care provider.  Eat more yogurt. This may help to keep your yeast infection from returning.  Maintain a healthy weight. If you need help losing weight, talk with your health care provider.  Keep your skin clean and dry.  If you have diabetes, keep your blood sugar under control. Contact a health care provider if:  Your symptoms go away and then return.  Your symptoms do not get better with treatment.  Your symptoms get worse.  Your rash spreads.  You have a fever  or chills.  You have new symptoms.  You have new warmth or redness of your skin. This information is not intended to replace advice given to you by your health care provider. Make sure you discuss any questions you have with your health care provider. Document Released: 03/12/2011 Document Revised: 02/18/2016 Document Reviewed: 12/26/2014 Elsevier Interactive Patient Education  2017 [employer]  

## 2016-09-23 NOTE — MAU Note (Signed)
Has pelvic pain, LLQ, started this morning.  Kind of feels like low menstrual cramps goes around to back.  The past couple months, any time something is inserted into vagina it burns, lasted about an hour. (pelvic, intercourse, etc)

## 2016-09-23 NOTE — MAU Provider Note (Signed)
History     CSN: [id number]  Arrival date and time: 09/23/16 1329   First Provider Initiated Contact with Patient 09/23/16 1415      Chief Complaint  Patient presents with  . Pelvic Pain   HPI Ms. Paula Castro is a 26 y.o. G2P0101 at [redacted]w[redacted]d who presents to MAU today with complaint of LLQ abdominal pain and low back pain since yesterday. The patient states that she was very active cleaning at home yesterday. She denies vaginal bleeding, discharge, UTI symptoms today. She has not taken anything for pain. She was also recently treated for yeast and complains of continued vaginal burning after intercourse and pelvic exams. She is receiving care at [facility] for this pregnancy.   OB History    Gravida Para Term Preterm AB Living   2 1 0 1 0 1   SAB TAB Ectopic Multiple Live Births   0 0 0 0 1      Past Medical History:  Diagnosis Date  . Environmental allergies   . Preeclampsia     Past Surgical History:  Procedure Laterality Date  . TONSILLECTOMY  2004    History reviewed. No pertinent family history.  Social History  Substance Use Topics  . Smoking status: Never Smoker  . Smokeless tobacco: Never Used  . Alcohol use No    Allergies: No Known Allergies  Prescriptions Prior to Admission  Medication Sig Dispense Refill Last Dose  . aspirin EC 81 MG tablet Take 81 mg by mouth daily.   09/23/2016 at Unknown time  . Prenatal Vit-Fe Fumarate-FA (PRENATAL VITAMIN PO) Take 1 tablet by mouth daily.    09/22/2016 at Unknown time  . omeprazole (PRILOSEC) 20 MG capsule Take 1 capsule (20 mg total) by mouth 2 (two) times daily. (Patient not taking: Reported on 09/23/2016) 60 capsule 0 Not Taking at Unknown time  . ondansetron (ZOFRAN ODT) 4 MG disintegrating tablet Take 1 tablet (4 mg total) by mouth every 8 (eight) hours as needed for nausea. (Patient not taking: Reported on 09/23/2016) 6 tablet 0 Not Taking at Unknown time    Review of Systems  Constitutional: Negative for fever.   Gastrointestinal: Positive for abdominal pain. Negative for constipation, diarrhea, nausea and vomiting.  Genitourinary: Positive for pelvic pain and vaginal pain. Negative for dysuria, frequency, urgency, vaginal bleeding and vaginal discharge.   Physical Exam   Blood pressure 131/79, pulse (!) 106, temperature 99 F (37.2 C), temperature source Oral, resp. rate 16, weight 205 lb (93 kg), last menstrual period 05/23/2016, SpO2 100 %.  Physical Exam  Nursing note and vitals reviewed. Constitutional: She is oriented to person, place, and time. She appears well-developed and well-nourished. No distress.  HENT:  Head: Normocephalic and atraumatic.  Cardiovascular: Normal rate.   Respiratory: Effort normal.  GI: Soft. She exhibits no distension and no mass. There is no tenderness. There is no rebound and no guarding.  Genitourinary: There is rash (mild erythema and edema noted bilaterally) and tenderness on the right labia. There is no lesion on the right labia. There is rash and tenderness on the left labia. There is no lesion on the left labia. Uterus is enlarged (slightly, exam limited by maternal body habitus). Uterus is not tender. Cervix exhibits friability (mild). Cervix exhibits no motion tenderness and no discharge. Right adnexum displays no mass and no tenderness. Left adnexum displays no mass and no tenderness. No bleeding in the vagina. Vaginal discharge (small amount of thin, white discharge noted) found.  Neurological: She is alert and oriented to person, place, and time.  Skin: Skin is warm and dry. No erythema.  Psychiatric: She has a normal mood and affect.  Dilation: Closed Effacement (%): Thick Cervical Position: Posterior Exam by:: Magnus Sinning, PA-C   Results for orders placed or performed during the hospital encounter of 09/23/16 (from the past 24 hour(s))  Urinalysis, Routine w reflex microscopic     Status: Abnormal   Collection Time: 09/23/16  1:50 PM  Result Value Ref  Range   Color, Urine YELLOW YELLOW   APPearance CLEAR CLEAR   Specific Gravity, Urine 1.024 1.005 - 1.030   pH 6.0 5.0 - 8.0   Glucose, UA NEGATIVE NEGATIVE mg/dL   Hgb urine dipstick NEGATIVE NEGATIVE   Bilirubin Urine NEGATIVE NEGATIVE   Ketones, ur 80 (A) NEGATIVE mg/dL   Protein, ur NEGATIVE NEGATIVE mg/dL   Nitrite NEGATIVE NEGATIVE   Leukocytes, UA NEGATIVE NEGATIVE  Wet prep, genital     Status: Abnormal   Collection Time: 09/23/16  2:28 PM  Result Value Ref Range   Yeast Wet Prep HPF POC NONE SEEN NONE SEEN   Trich, Wet Prep NONE SEEN NONE SEEN   Clue Cells Wet Prep HPF POC NONE SEEN NONE SEEN   WBC, Wet Prep HPF POC FEW (A) NONE SEEN   Sperm NONE SEEN      MAU Course  Procedures None  MDM FHR - 149 bpm with doppler UA today shows evidence of dehydration  Wet prep and GC/Chlamydia obtained to confirm yeast has been adequately treated and since GC/Chlamydia was not adequate at last visit.   Assessment and Plan  A: SIUP at [redacted]w[redacted]d Round ligament pain  Cutaneous yeast  P:  Discharge home Rx for Triamcinolone and Nystatin given  Second trimester precautions discussed GC/Chlamydia pending Discussed use of Tylenol, abdominal binder, warm bath/shower and moderation of activity for round ligament pain Patient advised to follow-up with [facility] as scheduled for routine prenatal care or sooner PRN Patient may return to MAU as needed or if her condition were to change or worsen  Marny Lowenstein, PA-C  09/23/2016, 3:31 PM

## 2016-09-24 LAB — GC/CHLAMYDIA PROBE AMP (~~LOC~~) NOT AT ARMC
Chlamydia: NEGATIVE
Neisseria Gonorrhea: NEGATIVE

## 2016-11-07 ENCOUNTER — Encounter: Payer: Self-pay | Admitting: *Deleted

## 2016-11-07 ENCOUNTER — Observation Stay
Admission: EM | Admit: 2016-11-07 | Discharge: 2016-11-07 | Disposition: A | Discharge status: Home / Self Care | Payer: 59 | Attending: Obstetrics & Gynecology | Admitting: Obstetrics & Gynecology

## 2016-11-07 DIAGNOSIS — Z3A00 Weeks of gestation of pregnancy not specified: Secondary | ICD-10-CM | POA: Diagnosis not present

## 2016-11-07 DIAGNOSIS — O368190 Decreased fetal movements, unspecified trimester, not applicable or unspecified: Secondary | ICD-10-CM | POA: Diagnosis not present

## 2016-11-07 DIAGNOSIS — J029 Acute pharyngitis, unspecified: Secondary | ICD-10-CM | POA: Diagnosis not present

## 2016-11-07 DIAGNOSIS — O368120 Decreased fetal movements, second trimester, not applicable or unspecified: Secondary | ICD-10-CM | POA: Diagnosis not present

## 2016-11-07 DIAGNOSIS — Z3A23 23 weeks gestation of pregnancy: Secondary | ICD-10-CM | POA: Diagnosis not present

## 2016-11-07 NOTE — Discharge Summary (Signed)
  See FPN 

## 2016-11-07 NOTE — Discharge Instructions (Signed)
Get plenty of rest and stay well hydrated!

## 2016-11-07 NOTE — OB Triage Note (Signed)
Recvd pt from ED. Pt c/o sore throat since last Tuesday. States she hasn't felt her baby move a whole lot and wanted to make sure everything was ok. Pt states she is feeling baby move often/good now. No LOF or vaginal bleeding.

## 2016-11-07 NOTE — Final Progress Note (Signed)
Physician Final Progress Note  Patient ID: Paula Castro MRN: [id number] DOB/AGE: 11/09/1990 25 y.o.  Admit date: 11/07/2016 Admitting provider: Nadara Mustard, MD Discharge date: 11/07/2016   Admission Diagnoses: Decreased fetal movement  Discharge Diagnoses:  Active Problems:   Pregnancy   DFM  Consults: None  Significant Findings/ Diagnostic Studies: Seen by triage for DFM, no other pain or bleeding.  Procedures: A NST procedure was performed with FHR monitoring and a normal baseline established, appropriate time of 20-40 minutes of evaluation, and accels >2 seen w 15x15 characteristics.  Results show a REACTIVE NST.   Discharge Condition: good  Disposition: 01-Home or Self Care  Diet: Regular diet  Discharge Activity: Activity as tolerated   Allergies as of 11/07/2016   No Known Allergies     Medication List    ASK your doctor about these medications   aspirin EC 81 MG tablet Take 81 mg by mouth daily.   nystatin cream Commonly known as:  MYCOSTATIN Apply to affected area 2 times daily   omeprazole 20 MG capsule Commonly known as:  PRILOSEC Take 1 capsule (20 mg total) by mouth 2 (two) times daily.   ondansetron 4 MG disintegrating tablet Commonly known as:  ZOFRAN ODT Take 1 tablet (4 mg total) by mouth every 8 (eight) hours as needed for nausea.   PRENATAL VITAMIN PO Take 1 tablet by mouth daily.   triamcinolone cream 0.1 % Commonly known as:  KENALOG Apply 1 application topically 2 (two) times daily.        Total time spent taking care of this patient: TRIAGE Patient presented for evaluation.  Patient had cervical exam by RN and this was reported to me. I reviewed her vital signs and fetal tracing, both of which were reassuring.  Patient was discharge as she was not laboring.   Signed: Letitia Libra 11/07/2016, 1:58 PM

## 2016-12-12 ENCOUNTER — Encounter: Payer: Self-pay | Admitting: Obstetrics and Gynecology

## 2016-12-12 ENCOUNTER — Other Ambulatory Visit (HOSPITAL_COMMUNITY)
Admission: RE | Admit: 2016-12-12 | Discharge: 2016-12-12 | Disposition: A | Discharge status: Home / Self Care | Payer: 59 | Source: Ambulatory Visit | Attending: Obstetrics and Gynecology | Admitting: Obstetrics and Gynecology

## 2016-12-12 ENCOUNTER — Encounter: Payer: Self-pay | Admitting: *Deleted

## 2016-12-12 ENCOUNTER — Ambulatory Visit (INDEPENDENT_AMBULATORY_CARE_PROVIDER_SITE_OTHER): Payer: 59 | Admitting: Obstetrics and Gynecology

## 2016-12-12 VITALS — BP 134/70 | HR 100 | Wt 218.0 lb

## 2016-12-12 DIAGNOSIS — O0990 Supervision of high risk pregnancy, unspecified, unspecified trimester: Secondary | ICD-10-CM | POA: Diagnosis present

## 2016-12-12 DIAGNOSIS — E669 Obesity, unspecified: Secondary | ICD-10-CM | POA: Diagnosis not present

## 2016-12-12 DIAGNOSIS — O0993 Supervision of high risk pregnancy, unspecified, third trimester: Secondary | ICD-10-CM | POA: Diagnosis not present

## 2016-12-12 DIAGNOSIS — O09893 Supervision of other high risk pregnancies, third trimester: Secondary | ICD-10-CM | POA: Insufficient documentation

## 2016-12-12 DIAGNOSIS — B3731 Acute candidiasis of vulva and vagina: Secondary | ICD-10-CM

## 2016-12-12 DIAGNOSIS — B373 Candidiasis of vulva and vagina: Secondary | ICD-10-CM

## 2016-12-12 DIAGNOSIS — O99213 Obesity complicating pregnancy, third trimester: Secondary | ICD-10-CM

## 2016-12-12 DIAGNOSIS — Z8759 Personal history of other complications of pregnancy, childbirth and the puerperium: Secondary | ICD-10-CM

## 2016-12-12 MED ORDER — CLOTRIMAZOLE 2 % VA CREA: 1.0000 | TOPICAL_CREAM | Freq: Every day | VAGINAL | 1 refills | Status: DC

## 2016-12-12 MED ORDER — METRONIDAZOLE 500 MG PO TABS: 500.0000 mg | ORAL_TABLET | Freq: Two times a day (BID) | ORAL | 0 refills | Status: DC

## 2016-12-12 NOTE — Progress Notes (Unsigned)
Last pap 12/2013 07/16/16: RPR - NonReactive, ABo/Rh - O+, HGb-12.6, HCT-37.6, Platelet-216, HepB-NR, HIV-NR, Rubella-+, Varicella-+,

## 2016-12-12 NOTE — Progress Notes (Signed)
Pt c/o vaginal rash Last pap 12/2013 07/16/16: RPR - NonReactive, ABo/Rh - O+, HGb-12.6, HCT-37.6, Platelet-216, HepB-NR, HIV-NR, Rubella-+, Varicella-+,

## 2016-12-12 NOTE — Progress Notes (Signed)
New OB Note  12/12/2016   Clinic: [facility]  Chief Complaint: transfer of care from [facility]  History of Present Illness: Ms. Hellickson is a 26 y.o. G2P0101 @ 29/0 weeks (EDC 8/23, based on Patient's last menstrual period was 05/23/2016.).  Preg complicated by has Supervision of high risk pregnancy, antepartum; History of severe pre-eclampsia; Vulvovaginal candidiasis; Short interval between pregnancies affecting pregnancy in third trimester, antepartum; Obesity in pregnancy, antepartum, third trimester; and Obesity, Class II, BMI 35-39.9 on her problem list.    No decreased FM or PTL s/s. Patient lives in the area now. Pt endorses white d/c and itching. She denies any s/s of pre-x   ROS: A 12-point review of systems was performed and negative, except as stated in the above HPI.  OBGYN History: As per HPI. OB History  Gravida Para Term Preterm AB Living  2 1 0 1 0 1  SAB TAB Ectopic Multiple Live Births  0 0 0 0 1    # Outcome Date GA Lbr Len/2nd Weight Sex Delivery Anes PTL Lv  2 Current           1 Preterm  [redacted]w[redacted]d    Vag-Spont   LIV     Birth Comments: System Generated. Please review and update pregnancy details.     Any issues with any prior pregnancies: yes, severe pre-x at 34wks and delivery Prior children are healthy, doing well, and without any problems or issues: yes History of pap smears: Yes. Last pap smear 12/2013 and results were NILM   Past Medical History: Past Medical History:  Diagnosis Date  . Environmental allergies   . Preeclampsia     Past Surgical History: Past Surgical History:  Procedure Laterality Date  . TONSILLECTOMY  2004    Family History:  No family history on file.  Social History:  Social History   Social History  . Marital status: Single    Spouse name: N/A  . Number of children: N/A  . Years of education: N/A   Occupational History  . Not on file.   Social History Main Topics  . Smoking status: Never  Smoker  . Smokeless tobacco: Never Used  . Alcohol use No  . Drug use: No  . Sexual activity: Yes    Birth control/ protection: Condom, Other-see comments     Comment: Pregnant   Other Topics Concern  . Not on file   Social History Narrative  . No narrative on file    Allergy: No Known Allergies  Health Maintenance:  Mammogram Up to Date: not applicable  Current Outpatient Medications: PNV, ASA 81mg   Physical Exam:  148/90 BP 134/70   Pulse 100   Wt 218 lb (98.9 kg)   LMP 05/23/2016   BMI 37.42 kg/m  Body mass index is 37.42 kg/m. Contractions: Not present Vag. Bleeding: None. Fundal height: 30 FHTs: 140s  General appearance: Well nourished, well developed female in no acute distress.  Neck:  Supple, normal appearance, and no thyromegaly  Cardiovascular: S1, S2 normal, no murmur, rub or gallop, regular rate and rhythm Respiratory:  Clear to auscultation bilateral. Normal respiratory effort Abdomen: gravid, nttp Neuro/Psych:  Normal mood and affect.  Skin:  Warm and dry.  Lymphatic:  No inguinal lymphadenopathy.   Pelvic exam: is not limited by body habitus EGBUS: w/ b/l erythema and whit cottage cheese like d/c.   Laboratory: U/A dip negative for protein today.  [facility] labs: negative 1st trimester, afp,   O  pos/Rub Imm/Var Imm/rpr neg/hiv neg/hepB surf Ag neg/  Imaging:  Negative anatomy u/s at [facility]  Assessment: pt stable  Plan: 1. Supervision of high risk pregnancy, antepartum Routine care. Growth u/s ordered. 28wk labs. D/w pt re: BC nv.  - [place] MFM OB DETAIL +14 WK; Future - Antibody screen - HIV antibody - RPR - Glucose Tolerance, 2 Hours w/1 Hour - CBC - Cervicovaginal ancillary only  2. History of severe pre-eclampsia Continue baby ASA. Will bring pt back in 4d for RN BP, u/a check and see her back qwk for these. FGR and PTB last pregnancy likely related this.  - [place] MFM OB DETAIL +14 WK; Future  3. Vulvovaginal candidiasis Clotrimazole  sent in  4. Short interval between pregnancies affecting pregnancy in third trimester, antepartum  5. Obesity in pregnancy, antepartum, third trimester  Problem list reviewed and updated.  Follow up in 4 days  The nature of [facility] with multiple MDs and other Advanced Practice Providers was explained to patient; also emphasized that residents, students are part of our team.  >50% of 25 min visit spent on counseling and coordination of care.     Cornelia Copa MD Attending Center for [facility])

## 2016-12-13 ENCOUNTER — Encounter: Payer: Self-pay | Admitting: Obstetrics and Gynecology

## 2016-12-13 ENCOUNTER — Telehealth: Payer: Self-pay | Admitting: *Deleted

## 2016-12-13 DIAGNOSIS — O24419 Gestational diabetes mellitus in pregnancy, unspecified control: Secondary | ICD-10-CM

## 2016-12-13 LAB — CBC
HEMOGLOBIN: 10.2 g/dL — AB (ref 11.1–15.9)
Hematocrit: 30.6 % — ABNORMAL LOW (ref 34.0–46.6)
MCH: 29.9 pg (ref 26.6–33.0)
MCHC: 33.3 g/dL (ref 31.5–35.7)
MCV: 90 fL (ref 79–97)
PLATELETS: 157 10*3/uL (ref 150–379)
RBC: 3.41 x10E6/uL — AB (ref 3.77–5.28)
RDW: 14.2 % (ref 12.3–15.4)
WBC: 5.8 10*3/uL (ref 3.4–10.8)

## 2016-12-13 LAB — CERVICOVAGINAL ANCILLARY ONLY
BACTERIAL VAGINITIS: NEGATIVE
CANDIDA VAGINITIS: NEGATIVE
CHLAMYDIA, DNA PROBE: NEGATIVE
Neisseria Gonorrhea: NEGATIVE
TRICH (WINDOWPATH): NEGATIVE

## 2016-12-13 LAB — RPR: RPR Ser Ql: NONREACTIVE

## 2016-12-13 LAB — GLUCOSE TOLERANCE, 2 HOURS W/ 1HR
GLUCOSE, 1 HOUR: 142 mg/dL (ref 65–179)
Glucose, 2 hour: 125 mg/dL (ref 65–152)
Glucose, Fasting: 108 mg/dL — ABNORMAL HIGH (ref 65–91)

## 2016-12-13 LAB — ANTIBODY SCREEN: Antibody Screen: NEGATIVE

## 2016-12-13 LAB — HIV ANTIBODY (ROUTINE TESTING W REFLEX): HIV Screen 4th Generation wRfx: NONREACTIVE

## 2016-12-13 MED ORDER — ACCU-CHEK FASTCLIX LANCETS MISC: 1.0000 [IU] | Freq: Four times a day (QID) | 12 refills | Status: DC

## 2016-12-13 MED ORDER — ACCU-CHEK NANO SMARTVIEW W/DEVICE KIT: 1.0000 | PACK | 0 refills | Status: DC

## 2016-12-13 MED ORDER — GLUCOSE BLOOD VI STRP: ORAL_STRIP | 12 refills | Status: DC

## 2016-12-13 NOTE — Telephone Encounter (Signed)
Pt aware of GDM - supplies sent to pharm - refer sent to N&D

## 2016-12-14 ENCOUNTER — Emergency Department: Payer: 59

## 2016-12-14 ENCOUNTER — Encounter: Payer: Self-pay | Admitting: Emergency Medicine

## 2016-12-14 ENCOUNTER — Emergency Department
Admission: EM | Admit: 2016-12-14 | Discharge: 2016-12-14 | Disposition: A | Discharge status: Home / Self Care | Payer: 59 | Attending: Emergency Medicine | Admitting: Emergency Medicine

## 2016-12-14 DIAGNOSIS — R5383 Other fatigue: Secondary | ICD-10-CM | POA: Diagnosis not present

## 2016-12-14 DIAGNOSIS — Z3A29 29 weeks gestation of pregnancy: Secondary | ICD-10-CM | POA: Insufficient documentation

## 2016-12-14 DIAGNOSIS — E860 Dehydration: Secondary | ICD-10-CM | POA: Diagnosis not present

## 2016-12-14 DIAGNOSIS — R531 Weakness: Secondary | ICD-10-CM | POA: Diagnosis present

## 2016-12-14 DIAGNOSIS — O9989 Other specified diseases and conditions complicating pregnancy, childbirth and the puerperium: Secondary | ICD-10-CM | POA: Insufficient documentation

## 2016-12-14 DIAGNOSIS — R000 Tachycardia, unspecified: Secondary | ICD-10-CM | POA: Diagnosis not present

## 2016-12-14 LAB — CBC WITH DIFFERENTIAL/PLATELET
BASOS ABS: 0 10*3/uL (ref 0–0.1)
BASOS PCT: 0 %
Eosinophils Absolute: 0 10*3/uL (ref 0–0.7)
Eosinophils Relative: 1 %
HEMATOCRIT: 29.2 % — AB (ref 35.0–47.0)
HEMOGLOBIN: 10 g/dL — AB (ref 12.0–16.0)
Lymphocytes Relative: 21 %
Lymphs Abs: 1 10*3/uL (ref 1.0–3.6)
MCH: 30 pg (ref 26.0–34.0)
MCHC: 34.4 g/dL (ref 32.0–36.0)
MCV: 87.3 fL (ref 80.0–100.0)
Monocytes Absolute: 0.8 10*3/uL (ref 0.2–0.9)
Monocytes Relative: 15 %
NEUTROS PCT: 63 %
Neutro Abs: 3.3 10*3/uL (ref 1.4–6.5)
Platelets: 146 10*3/uL — ABNORMAL LOW (ref 150–440)
RBC: 3.34 MIL/uL — ABNORMAL LOW (ref 3.80–5.20)
RDW: 13.7 % (ref 11.5–14.5)
WBC: 5.1 10*3/uL (ref 3.6–11.0)

## 2016-12-14 LAB — URINALYSIS, COMPLETE (UACMP) WITH MICROSCOPIC
BACTERIA UA: NONE SEEN
BILIRUBIN URINE: NEGATIVE
Glucose, UA: NEGATIVE mg/dL
Hgb urine dipstick: NEGATIVE
Ketones, ur: 80 mg/dL — AB
LEUKOCYTES UA: NEGATIVE
NITRITE: NEGATIVE
PROTEIN: NEGATIVE mg/dL
Specific Gravity, Urine: 1.011 (ref 1.005–1.030)
pH: 6 (ref 5.0–8.0)

## 2016-12-14 LAB — COMPREHENSIVE METABOLIC PANEL
ALK PHOS: 70 U/L (ref 38–126)
ALT: 11 U/L — ABNORMAL LOW (ref 14–54)
ANION GAP: 7 (ref 5–15)
AST: 22 U/L (ref 15–41)
Albumin: 2.9 g/dL — ABNORMAL LOW (ref 3.5–5.0)
BILIRUBIN TOTAL: 0.7 mg/dL (ref 0.3–1.2)
BUN: <5 mg/dL — ABNORMAL LOW (ref 6–20)
CALCIUM: 9 mg/dL (ref 8.9–10.3)
CO2: 23 mmol/L (ref 22–32)
Chloride: 106 mmol/L (ref 101–111)
Creatinine, Ser: 0.38 mg/dL — ABNORMAL LOW (ref 0.44–1.00)
GFR calc non Af Amer: >60 mL/min (ref >60)
Glucose, Bld: 100 mg/dL — ABNORMAL HIGH (ref 65–99)
Potassium: 3.5 mmol/L (ref 3.5–5.1)
SODIUM: 136 mmol/L (ref 135–145)
TOTAL PROTEIN: 6.1 g/dL — AB (ref 6.5–8.1)

## 2016-12-14 MED ORDER — SODIUM CHLORIDE 0.9 % IV BOLUS (SEPSIS)
1000.0000 mL | Freq: Once | INTRAVENOUS | Status: AC
Start: 2016-12-14 — End: 2016-12-14
  Administered 2016-12-14: 1000 mL via INTRAVENOUS

## 2016-12-14 MED ORDER — SODIUM CHLORIDE 0.9 % IV BOLUS (SEPSIS)
1000.0000 mL | Freq: Once | INTRAVENOUS | Status: AC
  Administered 2016-12-14: 1000 mL via INTRAVENOUS

## 2016-12-14 NOTE — ED Triage Notes (Signed)
General weakness. No contractions or vag bleed. [redacted] weeks pregnant with gestational diabetes.

## 2016-12-14 NOTE — ED Provider Notes (Signed)
[facility] Emergency Department Provider Note ____________________________________________   I have reviewed the triage vital signs and the triage nursing note.  HISTORY  Chief Complaint Weakness   Historian Patient  HPI STASHIA SIA is a 26 y.o. female G2P1 approximately [redacted] weeks pregnant presents to ER complaining of fatigue that started about Wednesday. She has been outside in the heat and thinks she might account dehydrated. Denies fever. Denies chest pain. Denies shortness of breath or trouble breathing. Denies coughing. Denies abdominal pain. She does occasionally have Braxton Hicks contractions. No pelvic pain. No vaginal bleeding.  Since symptoms are ongoing she wanted to be checked out for the fatigue.    Past Medical History:  Diagnosis Date  . Environmental allergies   . Preeclampsia     Patient Active Problem List   Diagnosis Date Noted  . GDM (gestational diabetes mellitus) 12/13/2016  . Supervision of high risk pregnancy, antepartum 12/12/2016  . History of severe pre-eclampsia 12/12/2016  . Vulvovaginal candidiasis 12/12/2016  . Short interval between pregnancies affecting pregnancy in third trimester, antepartum 12/12/2016  . Obesity in pregnancy, antepartum, third trimester 12/12/2016  . Obesity, Class II, BMI 35-39.9 12/12/2016    Past Surgical History:  Procedure Laterality Date  . TONSILLECTOMY  2004    Prior to Admission medications   Medication Sig Start Date End Date Taking? Authorizing Provider  ACCU-CHEK FASTCLIX LANCETS MISC 1 Units by Percutaneous route 4 (four) times daily. 12/13/16   Aletha Halim, MD  aspirin EC 81 MG tablet Take 81 mg by mouth daily.    [provider]  Blood Glucose Monitoring Suppl (ACCU-CHEK NANO SMARTVIEW) w/Device KIT 1 kit by Subdermal route as directed. Check blood sugars for fasting, and two hours after breakfast, lunch and dinner (4 checks daily) 12/13/16   Aletha Halim, MD   clotrimazole (GYNE-LOTRIMIN 3) 2 % vaginal cream Place 1 Applicatorful vaginally at bedtime. 12/12/16   Aletha Halim, MD  glucose blood (ACCU-CHEK SMARTVIEW) test strip Use as instructed to check blood sugars 12/13/16   Aletha Halim, MD  metroNIDAZOLE (FLAGYL) 500 MG tablet Take 1 tablet (500 mg total) by mouth 2 (two) times daily. 12/12/16   Aletha Halim, MD  nystatin cream (MYCOSTATIN) Apply to affected area 2 times daily 09/23/16   Luvenia Redden, PA-C  Prenatal Vit-Fe Fumarate-FA (PRENATAL VITAMIN PO) Take 1 tablet by mouth daily.     [provider]  triamcinolone cream (KENALOG) 0.1 % Apply 1 application topically 2 (two) times daily. 09/23/16   Luvenia Redden, PA-C    No Known Allergies  No family history on file.  Social History Social History  Substance Use Topics  . Smoking status: Never Smoker  . Smokeless tobacco: Never Used  . Alcohol use No    Review of Systems  Constitutional: Negative for fever. Eyes: Negative for visual changes. ENT: Negative for sore throat. Cardiovascular: Negative for chest pain. Respiratory: Negative for shortness of breath. Gastrointestinal: Negative for abdominal pain, vomiting and diarrhea. Genitourinary: Negative for dysuria. Musculoskeletal: Negative for back pain. Skin: Negative for rash. Neurological: Negative for headache.  ____________________________________________   PHYSICAL EXAM:  VITAL SIGNS: ED Triage Vitals  Enc Vitals Group     BP 12/14/16 1036 123/80     Pulse Rate 12/14/16 1036 (!) 123     Resp 12/14/16 1036 18     Temp 12/14/16 1036 98.6 F (37 C)     Temp Source 12/14/16 1036 Oral     SpO2 12/14/16  1036 100 %     Weight 12/14/16 1037 210 lb (95.3 kg)     Height 12/14/16 1037 5' 4"  (1.626 m)     Head Circumference --      Peak Flow --      Pain Score --      Pain Loc --      Pain Edu? --      Excl. in [place]? --      Constitutional: Alert and oriented. Well appearing and in no  distress. HEENT   Head: Normocephalic and atraumatic.      Eyes: Conjunctivae are normal. Pupils equal and round.       Ears:         Nose: No congestion/rhinnorhea.   Mouth/Throat: Mucous membranes are moist.   Neck: No stridor. Cardiovascular/Chest: Tachycardic rate, regular rhythm.  No murmurs, rubs, or gallops. Respiratory: Normal respiratory effort without tachypnea nor retractions. Breath sounds are clear and equal bilaterally. No wheezes/rales/rhonchi. Gastrointestinal: Soft. No distention, no guarding, no rebound. Nontender.  Gravid above the umbilicus.  Genitourinary/rectal:Deferred Musculoskeletal: Nontender with normal range of motion in all extremities. No joint effusions.  No lower extremity tenderness.  No edema.  No calf swelling. Neurologic:  Normal speech and language. No gross or focal neurologic deficits are appreciated. Skin:  Skin is warm, dry and intact. No rash noted. Psychiatric: Mood and affect are normal. Speech and behavior are normal. Patient exhibits appropriate insight and judgment.   ____________________________________________  LABS (pertinent positives/negatives)  Labs Reviewed  COMPREHENSIVE METABOLIC PANEL - Abnormal; Notable for the following:       Result Value   Glucose, Bld 100 (*)    BUN <5 (*)    Creatinine, Ser 0.38 (*)    Total Protein 6.1 (*)    Albumin 2.9 (*)    ALT 11 (*)    All other components within normal limits  CBC WITH DIFFERENTIAL/PLATELET - Abnormal; Notable for the following:    RBC 3.34 (*)    Hemoglobin 10.0 (*)    HCT 29.2 (*)    Platelets 146 (*)    All other components within normal limits  URINALYSIS, COMPLETE (UACMP) WITH MICROSCOPIC - Abnormal; Notable for the following:    Color, Urine YELLOW (*)    APPearance CLEAR (*)    Ketones, ur 80 (*)    Squamous Epithelial / LPF 0-5 (*)    All other components within normal limits    ____________________________________________    EKG I, Lisa Roca,  MD, the attending physician have personally viewed and interpreted all ECGs.  103 bpm. Sinus tachycardia. Narrow QRS. Normal axis. S wave in lead 1 and Q wave in lead 3 T-wave flattening in lead 3. Nonspecific ST and T-wave ____________________________________________  RADIOLOGY All Xrays were viewed by me. Imaging interpreted by Radiologist.  Ultrasound lower extremities for DVT:  IMPRESSION: No evidence of lower extremity DVT. __________________________________________  PROCEDURES  Procedure(s) performed: None  Critical Care performed: None  ____________________________________________   ED COURSE / ASSESSMENT AND PLAN  Pertinent labs & imaging results that were available during my care of the patient were reviewed by me and considered in my medical decision making (see chart for details).   Mr. Lissa Merlin here with complaint of fatigue and just feeling exhausted. She denies any respiratory or cardiac symptoms at all.  She is tachycardic here and states she has been out in the heat. I'm going to check orthostatics and also give her a fluid bolus as well as  check laboratory studies.  EKG shows sinus tachycardia and she does go between 105 and 120. S1 every 3 T3 slightly indicated on EKG, but without hypoxia or respiratory or cardiac complaints, I discussed with patient that at this point in time I do not suspect PE. We discussed at length return precautions with respect to this.  We chose to proceed with [place] of le, and this was negative for DVT.  HR 90-105 on reeval after 2nd liter of fluid, improved from arrival and I suspect some dehydration contributing to fatigue and tachycardia.  Okay for discharge now.    CONSULTATIONS:   None   Patient / Family / Caregiver informed of clinical course, medical decision-making process, and agree with plan.   I discussed return precautions, follow-up instructions, and discharge instructions with patient and/or family.  Discharge  Instructions : You were evaluated for fatigue, and although no certain cause was found, your exam and evaluation are overall reassuring in the emergency department stay. Her heart rate was elevated and did improve with fluids. I suspect that her symptoms may have been due to some dehydration. Make sure you staying very well-hydrated.   As we discussed, your EKG shows some minor findings on there raising some indication for evaluation of blood clots in the lung.  We discussed that since you have no low oxygen, no chest pain and no shortness of breath the risk of pursuing further evaluation with Chest CT is riskier in terms of radiation exposure than holding off at this point especially with normal ultrasound of the legs for no blood clot in the legs.   Return to the emergency room immediately for any chest pain, shortness of breath, trouble breathing, fevers, dizziness or passing out, or any other symptoms concerning to you.  ___________________________________________   FINAL CLINICAL IMPRESSION(S) / ED DIAGNOSES   Final diagnoses:  Fatigue  Dehydration              Note: This dictation was prepared with Dragon dictation. Any transcriptional errors that result from this process are unintentional    Lisa Roca, MD 12/14/16 1431

## 2016-12-14 NOTE — Discharge Instructions (Signed)
You were evaluated for fatigue, and although no certain cause was found, your exam and evaluation are overall reassuring in the emergency department stay. Her heart rate was elevated and did improve with fluids. I suspect that her symptoms may have been due to some dehydration. Make sure you staying very well-hydrated.   As we discussed, your EKG shows some minor findings on there raising some indication for evaluation of blood clots in the lung.  We discussed that since you have no low oxygen, no chest pain and no shortness of breath the risk of pursuing further evaluation with Chest CT is riskier in terms of radiation exposure than holding off at this point especially with normal ultrasound of the legs for no blood clot in the legs.   Return to the emergency room immediately for any chest pain, shortness of breath, trouble breathing, fevers, dizziness or passing out, or any other symptoms concerning to you.

## 2016-12-14 NOTE — ED Notes (Signed)
Patient transported to Ultrasound 

## 2016-12-16 ENCOUNTER — Encounter: Payer: Self-pay | Admitting: *Deleted

## 2016-12-16 ENCOUNTER — Other Ambulatory Visit: Payer: 59

## 2016-12-17 ENCOUNTER — Telehealth: Payer: Self-pay | Admitting: *Deleted

## 2016-12-17 DIAGNOSIS — O24410 Gestational diabetes mellitus in pregnancy, diet controlled: Secondary | ICD-10-CM

## 2016-12-17 MED ORDER — ONETOUCH ULTRASOFT LANCETS MISC: 12 refills | Status: DC

## 2016-12-17 MED ORDER — GLUCOSE BLOOD VI STRP: ORAL_STRIP | 12 refills | Status: DC

## 2016-12-17 NOTE — Telephone Encounter (Signed)
Received fax from [facility] pharmacy stating pt insurance would cover the one touch lancets and strips for blood sugar monitoring.  Sent updated rx to pharmacy.

## 2016-12-23 ENCOUNTER — Encounter: Payer: Self-pay | Admitting: Obstetrics and Gynecology

## 2016-12-23 ENCOUNTER — Encounter: Payer: 59 | Admitting: Obstetrics and Gynecology

## 2016-12-23 NOTE — Progress Notes (Signed)
Patient did not keep OB appointment for 12/23/2016.  Anayely Constantine, Jr MD Attending [facility])   

## 2016-12-30 ENCOUNTER — Other Ambulatory Visit: Payer: 59

## 2016-12-31 ENCOUNTER — Telehealth: Payer: Self-pay | Admitting: Radiology

## 2016-12-31 NOTE — Telephone Encounter (Signed)
Called and left voicemail on cell phone, patient has been a "no show" for past 2 OB visits and wanted to see about getting her next appointment scheduled.

## 2017-01-02 ENCOUNTER — Ambulatory Visit (HOSPITAL_COMMUNITY): Admission: RE | Admit: 2017-01-02 | Payer: 59 | Source: Ambulatory Visit

## 2017-02-01 ENCOUNTER — Observation Stay
Admission: EM | Admit: 2017-02-01 | Discharge: 2017-02-01 | Disposition: A | Discharge status: Home / Self Care | Payer: 59 | Attending: Obstetrics and Gynecology | Admitting: Obstetrics and Gynecology

## 2017-02-01 DIAGNOSIS — Z7982 Long term (current) use of aspirin: Secondary | ICD-10-CM | POA: Insufficient documentation

## 2017-02-01 DIAGNOSIS — Z79899 Other long term (current) drug therapy: Secondary | ICD-10-CM | POA: Insufficient documentation

## 2017-02-01 DIAGNOSIS — O26893 Other specified pregnancy related conditions, third trimester: Principal | ICD-10-CM | POA: Insufficient documentation

## 2017-02-01 DIAGNOSIS — O0990 Supervision of high risk pregnancy, unspecified, unspecified trimester: Secondary | ICD-10-CM

## 2017-02-01 DIAGNOSIS — Z3A36 36 weeks gestation of pregnancy: Secondary | ICD-10-CM | POA: Insufficient documentation

## 2017-02-01 DIAGNOSIS — M545 Low back pain: Secondary | ICD-10-CM

## 2017-02-01 NOTE — Discharge Summary (Signed)
Physician Final Progress Note  Patient ID: LEMA HEINKEL MRN: [id number] DOB/AGE: [birth year] 26 y.o.  Admit date: 02/01/2017 Admitting provider: Rod Can, CNM Discharge date: 02/01/2017   Admission Diagnoses: back pain  Discharge Diagnoses:  Active Problems:   Indication for care in labor and delivery, antepartum IUP at 72w2dwith reactive NST, not in labor, sciatica pain  History of Present Illness: The patient is a 26y.o. female GG45P0101at 331w2dho presents for back pain that is intermittent since yesterday. The patient is not sure how often she feels the pain that is primarily in her lower back and radiates into her buttocks. She denies contractions although she is not sure what they feel like. She was induced at 34 weeks with her first baby for preeclampsia and fetal growth restriction. She denies LOF, VB. She admits good fetal movement.    Past Medical History:  Diagnosis Date  . Environmental allergies   . Preeclampsia     Past Surgical History:  Procedure Laterality Date  . TONSILLECTOMY  2004    No current facility-administered medications on file prior to encounter.    Current Outpatient Prescriptions on File Prior to Encounter  Medication Sig Dispense Refill  . aspirin EC 81 MG tablet Take 81 mg by mouth daily.    . Prenatal Vit-Fe Fumarate-FA (PRENATAL VITAMIN PO) Take 1 tablet by mouth daily.     . Blood Glucose Monitoring Suppl (ACCU-CHEK NANO SMARTVIEW) w/Device KIT 1 kit by Subdermal route as directed. Check blood sugars for fasting, and two hours after breakfast, lunch and dinner (4 checks daily) 1 kit 0  . clotrimazole (GYNE-LOTRIMIN 3) 2 % vaginal cream Place 1 Applicatorful vaginally at bedtime. (Patient not taking: Reported on 02/01/2017) 21 g 1  . glucose blood (ONE TOUCH ULTRA TEST) test strip Use as instructed to check blood sugars four times daily 120 each 12  . Lancets (ONETOUCH ULTRASOFT) lancets Use as instructed to check blood sugars four  times daily 120 each 12  . metroNIDAZOLE (FLAGYL) 500 MG tablet Take 1 tablet (500 mg total) by mouth 2 (two) times daily. (Patient not taking: Reported on 02/01/2017) 14 tablet 0  . nystatin cream (MYCOSTATIN) Apply to affected area 2 times daily (Patient not taking: Reported on 02/01/2017) 15 g 0  . triamcinolone cream (KENALOG) 0.1 % Apply 1 application topically 2 (two) times daily. (Patient not taking: Reported on 02/01/2017) 30 g 0    No Known Allergies  Social History   Social History  . Marital status: Single    Spouse name: N/A  . Number of children: N/A  . Years of education: N/A   Occupational History  . Not on file.   Social History Main Topics  . Smoking status: Never Smoker  . Smokeless tobacco: Never Used  . Alcohol use No  . Drug use: No  . Sexual activity: Yes    Birth control/ protection: Condom, Other-see comments     Comment: Pregnant   Other Topics Concern  . Not on file   Social History Narrative  . No narrative on file    Physical Exam: BP 124/68 (BP Location: Left Arm)   Pulse (!) 107   Temp 98.5 F (36.9 C) (Oral)   Resp 20   Ht 5' 4"  (1.626 m)   Wt 218 lb (98.9 kg)   LMP 05/23/2016   SpO2 100%   BMI 37.42 kg/m   Gen: NAD CV: RRR Pulm: CTAB Pelvic: FT/thick/high/ posterior Toco: irritability Fetal Well  Being: 135 bpm, moderate variability, +accelerations, -decelerations Ext: trace edema  Consults: None  Significant Findings/ Diagnostic Studies: none  Procedures: NST  Discharge Condition: good  Disposition: 01-Home or Self Care  Diet: Regular diet  Discharge Activity: Activity as tolerated, comfort measures for back pain, stretching, hands and knees, abdominal support band, hydrotherapy, heat/ice, tylenol  Discharge Instructions    Discharge activity:  No Restrictions    Complete by:  As directed    Discharge diet:  No restrictions    Complete by:  As directed    Fetal Kick Count:  Lie on our left side for one hour after a  meal, and count the number of times your baby kicks.  If it is less than 5 times, get up, move around and drink some juice.  Repeat the test 30 minutes later.  If it is still less than 5 kicks in an hour, notify your doctor.    Complete by:  As directed    No sexual activity restrictions    Complete by:  As directed    Notify physician for a general feeling that "something is not right"    Complete by:  As directed    Notify physician for increase or change in vaginal discharge    Complete by:  As directed    Notify physician for intestinal cramps, with or without diarrhea, sometimes described as "gas pain"    Complete by:  As directed    Notify physician for leaking of fluid    Complete by:  As directed    Notify physician for low, dull backache, unrelieved by heat or Tylenol    Complete by:  As directed    Notify physician for menstrual like cramps    Complete by:  As directed    Notify physician for pelvic pressure    Complete by:  As directed    Notify physician for uterine contractions.  These may be painless and feel like the uterus is tightening or the baby is  "balling up"    Complete by:  As directed    Notify physician for vaginal bleeding    Complete by:  As directed    PRETERM LABOR:  Includes any of the follwing symptoms that occur between 20 - [redacted] weeks gestation.  If these symptoms are not stopped, preterm labor can result in preterm delivery, placing your baby at risk    Complete by:  As directed      Allergies as of 02/01/2017   No Known Allergies     Medication List    STOP taking these medications   ACCU-CHEK NANO SMARTVIEW w/Device Kit   clotrimazole 2 % vaginal cream Commonly known as:  GYNE-LOTRIMIN 3   glucose blood test strip Commonly known as:  ONE TOUCH ULTRA TEST   metroNIDAZOLE 500 MG tablet Commonly known as:  FLAGYL   nystatin cream Commonly known as:  MYCOSTATIN   onetouch ultrasoft lancets   triamcinolone cream 0.1 % Commonly known as:   KENALOG     TAKE these medications   aspirin EC 81 MG tablet Take 81 mg by mouth daily.   PRENATAL VITAMIN PO Take 1 tablet by mouth daily.      Keep regular scheduled appointment with Ob care provider  Total time spent taking care of this patient: 15 minutes  Signed: Rod Can, CNM  02/01/2017, 4:47 PM

## 2017-02-01 NOTE — OB Triage Note (Addendum)
Pt presents c/o lower back pain and abdominal cramping since yesterday. Pt states pain comes and goes rating it an 8/10 on pain scale. Unrelieved by tylenol. Denies any bleeding or LOF. Reports positive fetal movement. Denies any burning or hurting while urinating. Pt states last intercourse was 3 weeks ago. Vitals WNL will continue to monitor.

## 2017-02-16 ENCOUNTER — Observation Stay
Admission: EM | Admit: 2017-02-16 | Discharge: 2017-02-16 | Disposition: A | Discharge status: Home / Self Care | Payer: 59 | Attending: Obstetrics and Gynecology | Admitting: Obstetrics and Gynecology

## 2017-02-16 ENCOUNTER — Encounter: Payer: Self-pay | Admitting: *Deleted

## 2017-02-16 DIAGNOSIS — M545 Low back pain: Secondary | ICD-10-CM | POA: Diagnosis not present

## 2017-02-16 DIAGNOSIS — O9989 Other specified diseases and conditions complicating pregnancy, childbirth and the puerperium: Secondary | ICD-10-CM

## 2017-02-16 DIAGNOSIS — O0990 Supervision of high risk pregnancy, unspecified, unspecified trimester: Secondary | ICD-10-CM

## 2017-02-16 DIAGNOSIS — O26893 Other specified pregnancy related conditions, third trimester: Secondary | ICD-10-CM

## 2017-02-16 DIAGNOSIS — M549 Dorsalgia, unspecified: Secondary | ICD-10-CM

## 2017-02-16 DIAGNOSIS — O99891 Other specified diseases and conditions complicating pregnancy: Secondary | ICD-10-CM

## 2017-02-16 DIAGNOSIS — Z7982 Long term (current) use of aspirin: Secondary | ICD-10-CM | POA: Diagnosis not present

## 2017-02-16 DIAGNOSIS — Z3A38 38 weeks gestation of pregnancy: Secondary | ICD-10-CM | POA: Insufficient documentation

## 2017-02-16 MED ORDER — CYCLOBENZAPRINE HCL 10 MG PO TABS: 10.0000 mg | ORAL_TABLET | Freq: Three times a day (TID) | ORAL | 0 refills | Status: DC | PRN

## 2017-02-16 NOTE — Discharge Instructions (Signed)
Abdominal Pain During Pregnancy Belly (abdominal) pain is common during pregnancy. Most of the time, it is not a serious problem. Other times, it can be a sign that something is wrong with the pregnancy. Always tell your doctor if you have belly pain. Follow these instructions at home: Monitor your belly pain for any changes. The following actions may help you feel better:  Do not have sex (intercourse) or put anything in your vagina until you feel better.  Rest until your pain stops.  Drink clear fluids if you feel sick to your stomach (nauseous). Do not eat solid food until you feel better.  Only take medicine as told by your doctor.  Keep all doctor visits as told.  Get help right away if:  You are bleeding, leaking fluid, or pieces of tissue come out of your vagina.  You have more pain or cramping.  You keep throwing up (vomiting).  You have pain when you pee (urinate) or have blood in your pee.  You have a fever.  You do not feel your baby moving as much.  You feel very weak or feel like passing out.  You have trouble breathing, with or without belly pain.  You have a very bad headache and belly pain.  You have fluid leaking from your vagina and belly pain.  You keep having watery poop (diarrhea).  Your belly pain does not go away after resting, or the pain gets worse. This information is not intended to replace advice given to you by your health care provider. Make sure you discuss any questions you have with your health care provider. Document Released: 06/12/2009 Document Revised: 01/31/2016 Document Reviewed: 01/21/2013 Elsevier Interactive Patient Education  2018 [place] Injury Prevention Back injuries can be very painful. They can also be difficult to heal. After having one back injury, you are more likely to injure your back again. It is important to learn how to avoid injuring or re-injuring your back. The following tips can help you to prevent  a back injury. What should I know about physical fitness?  Exercise for 30 minutes per day on most days of the week or as told by your doctor. Make sure to: ? Do aerobic exercises, such as walking, jogging, biking, or swimming. ? Do exercises that increase balance and strength, such as tai chi and yoga. ? Do stretching exercises. This helps with flexibility. ? Try to develop strong belly (abdominal) muscles. Your belly muscles help to support your back.  Stay at a healthy weight. This helps to decrease your risk of a back injury. What should I know about my diet?  Talk with your doctor about your overall diet. Take supplements and vitamins only as told by your doctor.  Talk with your doctor about how much calcium and vitamin D you need each day. These nutrients help to prevent weakening of the bones (osteoporosis).  Include good sources of calcium in your diet, such as: ? Dairy products. ? Green leafy vegetables. ? Products that have had calcium added to them (fortified).  Include good sources of vitamin D in your diet, such as: ? Milk. ? Foods that have had vitamin D added to them. What should I know about my posture?  Sit up straight and stand up straight. Avoid leaning forward when you sit or hunching over when you stand.  Choose chairs that have good low-back (lumbar) support.  If you work at a desk, sit close to it so you do  not need to lean over. Keep your chin tucked in. Keep your neck drawn back. Keep your elbows bent so your arms look like the letter "L" (right angle).  Sit high and close to the steering wheel when you drive. Add a low-back support to your car seat, if needed.  Avoid sitting or standing in one position for very long. Take breaks to get up, stretch, and walk around at least one time every hour. Take breaks every hour if you are driving for long periods of time.  Sleep on your side with your knees slightly bent, or sleep on your back with a pillow under  your knees. Do not lie on the front of your body to sleep. What should I know about lifting, twisting, and reaching? Lifting and Heavy Lifting   Avoid heavy lifting, especially lifting over and over again. If you must do heavy lifting: ? Stretch before lifting. ? Work slowly. ? Rest between lifts. ? Use a tool such as a cart or a dolly to move objects if one is available. ? Make several small trips instead of carrying one heavy load. ? Ask for help when you need it, especially when moving big objects.  Follow these steps when lifting: ? Stand with your feet shoulder-width apart. ? Get as close to the object as you can. Do not pick up a heavy object that is far from your body. ? Use handles or lifting straps if they are available. ? Bend at your knees. Squat down, but keep your heels off the floor. ? Keep your shoulders back. Keep your chin tucked in. Keep your back straight. ? Lift the object slowly while you tighten the muscles in your legs, belly, and butt. Keep the object as close to the center of your body as possible.  Follow these steps when putting down a heavy load: ? Stand with your feet shoulder-width apart. ? Lower the object slowly while you tighten the muscles in your legs, belly, and butt. Keep the object as close to the center of your body as possible. ? Keep your shoulders back. Keep your chin tucked in. Keep your back straight. ? Bend at your knees. Squat down, but keep your heels off the floor. ? Use handles or lifting straps if they are available. Twisting and Reaching  Avoid lifting heavy objects above your waist.  Do not twist at your waist while you are lifting or carrying a load. If you need to turn, move your feet.  Do not bend over without bending at your knees.  Avoid reaching over your head, across a table, or for an object on a high surface. What are some other tips?  Avoid wet floors and icy ground. Keep sidewalks clear of ice to prevent falls.  Do  not sleep on a mattress that is too soft or too hard.  Keep items that you use often within easy reach.  Put heavier objects on shelves at waist level, and put lighter objects on lower or higher shelves.  Find ways to lower your stress, such as: ? Exercise. ? Massage. ? Relaxation techniques.  Talk with your doctor if you feel anxious or depressed. These conditions can make back pain worse.  Wear flat heel shoes with cushioned soles.  Avoid making quick (sudden) movements.  Use both shoulder straps when carrying a backpack.  Do not use any tobacco products, including cigarettes, chewing tobacco, or electronic cigarettes. If you need help quitting, ask your doctor. This information is not  intended to replace advice given to you by your health care provider. Make sure you discuss any questions you have with your health care provider. Document Released: 12/11/2007 Document Revised: 11/30/2015 Document Reviewed: 06/28/2014 Elsevier Interactive Patient Education  2018 [employer].   Musculoskeletal Pain Musculoskeletal pain is muscle and bone aches and pains. This pain can occur in any part of the body. Follow these instructions at home:  Only take medicines for pain, discomfort, or fever as told by your health care provider.  You may continue all activities unless the activities cause more pain. When the pain lessens, slowly resume normal activities. Gradually increase the intensity and duration of the activities or exercise.  During periods of severe pain, bed rest may be helpful. Lie or sit in any position that is comfortable, but get out of bed and walk around at least every several hours.  If directed, put ice on the injured area. ? Put ice in a plastic bag. ? Place a towel between your skin and the bag. ? Leave the ice on for 20 minutes, 2-3 times a day. Contact a health care provider if:  Your pain is getting worse.  Your pain is not relieved with medicines.  You lose  function in the area of the pain if the pain is in your arms, legs, or neck. This information is not intended to replace advice given to you by your health care provider. Make sure you discuss any questions you have with your health care provider. Document Released: 06/24/2005 Document Revised: 12/05/2015 Document Reviewed: 02/26/2013 Elsevier Interactive Patient Education  2017 [employer].

## 2017-02-16 NOTE — Final Progress Note (Signed)
Physician Final Progress Note  Patient ID: Paula Castro MRN: [id number] DOB/AGE: 25-09-1990 25 y.o.  Admit date: 02/16/2017 Admitting provider: Conard Novak, MD Discharge date: 02/16/2017   Admission Diagnoses:  1) intrauterine pregnancy at [redacted]w[redacted]d  2) low back pain in pregnancy  Discharge Diagnoses:  1) intrauterine pregnancy at [redacted]w[redacted]d  2) low back pain in pregnancy  History of Present Illness: The patient is a 26 y.o. female G2P0101 at [redacted]w[redacted]d who presents for acute-onset low back pain in pregnancy.  She was out shopping today and started having pain that was in her bilateral lower back radiating around to her lower abdomen.  The pain is described as a pressure and cramping. Walking makes it worse.  Lying down makes it better. Denies any associated symptoms.  Specifically, she denies fevers, chills, diarrhea, urinary symptoms, vaginal symptoms. She denies contractions, leakage of fluid, vaginal bleeding. She notes +FM.     Hospital Course:  Patient admitted to L&D.  Exam consistent with MSK-origin pain.  No uterine tenderness.  Some intermittent uterine activity on tocometry, but no cervical change since her last appointment at [facility] a week ago.  Patient discharge with flexeril and instructions for otherwise conservative management.  Encouraged to keep her follow up appointment at [facility] in 2 days. Fetal tracing reactive.  Afebrile, normotensive.       Past Medical History:  Diagnosis Date  . Environmental allergies   . Preeclampsia          Past Surgical History:  Procedure Laterality Date  . TONSILLECTOMY  2004    No current facility-administered medications on file prior to encounter.          Current Outpatient Prescriptions on File Prior to Encounter  Medication Sig Dispense Refill  . aspirin EC 81 MG tablet Take 81 mg by mouth daily.    . Prenatal Vit-Fe Fumarate-FA (PRENATAL VITAMIN PO) Take 1 tablet by mouth daily.       No Known  Allergies  Social History        Social History  . Marital status: Single    Spouse name: N/A  . Number of children: N/A  . Years of education: N/A      Occupational History  . Not on file.         Social History Main Topics  . Smoking status: Never Smoker  . Smokeless tobacco: Never Used  . Alcohol use No  . Drug use: No  . Sexual activity: Yes    Birth control/ protection: Condom, Other-see comments     Comment: Pregnant       Other Topics Concern  . Not on file   Social History Narrative  . No narrative on file   Family History: denies history of gyn cancer  Physical Exam: BP 125/74 (BP Location: Left Arm)   Pulse 97   Temp 98.4 F (36.9 C) (Oral)   Resp 18   Ht 5\' 4"  (1.626 m)   Wt 211 lb (95.7 kg)   LMP 05/23/2016   BMI 36.22 kg/m   Gen: NAD CV: RRR Pulm: CTAB Abd; Gravid, NT MSK; ttp along bilateral gluteal area just below iliac crest.  Some mild inguinal tenderness.   Pelvic: 1cm per RN Ext: no e/c/t  Consults: None  Significant Findings/ Diagnostic Studies: none  Procedures: NST Baseline FHR: 140 beats/min Variability: moderate Accelerations: present Decelerations: absent Tocometry: intermittent, not frequent  Interpretation:  INDICATIONS: back pain pregnancy RESULTS:  A NST procedure was performed with FHR  monitoring and a normal baseline established, appropriate time of 20-40 minutes of evaluation, and accels >2 seen w 15x15 characteristics.  Results show a REACTIVE NST.    Discharge Condition: stable  Disposition: 01-Home or Self Care  Diet: Regular diet  Discharge Activity: Activity as tolerated  Allergies as of 02/16/2017   No Known Allergies             Medication List     TAKE these medications   aspirin EC 81 MG tablet Take 81 mg by mouth daily.   cyclobenzaprine 10 MG tablet Commonly known as:  FLEXERIL Take 1 tablet (10 mg total) by mouth 3 (three) times daily as needed for muscle  spasms.   PRENATAL VITAMIN PO Take 1 tablet by mouth daily.       Total time spent taking care of this patient: 25 minutes  Signed: Thomasene Mohair, MD  02/16/2017, 7:33 PM

## 2017-02-16 NOTE — Discharge Summary (Signed)
Physician Final Progress Note  Patient ID: Paula Castro MRN: [id number] DOB/AGE: 11/27/1990 26 y.o.  Admit date: 02/16/2017 Admitting provider: Fedor Kazmierski D Nakeesha Bowler, MD Discharge date: 02/16/2017   Admission Diagnoses:  1) intrauterine pregnancy at [redacted]w[redacted]d  2) low back pain in pregnancy  Discharge Diagnoses:  1) intrauterine pregnancy at [redacted]w[redacted]d  2) low back pain in pregnancy  History of Present Illness: The patient is a 26 y.o. female G2P0101 at [redacted]w[redacted]d who presents for acute-onset low back pain in pregnancy.  She was out shopping today and started having pain that was in her bilateral lower back radiating around to her lower abdomen.  The pain is described as a pressure and cramping. Walking makes it worse.  Lying down makes it better. Denies any associated symptoms.  Specifically, she denies fevers, chills, diarrhea, urinary symptoms, vaginal symptoms. She denies contractions, leakage of fluid, vaginal bleeding. She notes +FM.     Hospital Course:  Patient admitted to L&D.  Exam consistent with MSK-origin pain.  No uterine tenderness.  Some intermittent uterine activity on tocometry, but no cervical change since her last appointment at [facility] a week ago.  Patient discharge with flexeril and instructions for otherwise conservative management.  Encouraged to keep her follow up appointment at [facility] in 2 days. Fetal tracing reactive.  Afebrile, normotensive.       Past Medical History:  Diagnosis Date  . Environmental allergies   . Preeclampsia          Past Surgical History:  Procedure Laterality Date  . TONSILLECTOMY  2004    No current facility-administered medications on file prior to encounter.          Current Outpatient Prescriptions on File Prior to Encounter  Medication Sig Dispense Refill  . aspirin EC 81 MG tablet Take 81 mg by mouth daily.    . Prenatal Vit-Fe Fumarate-FA (PRENATAL VITAMIN PO) Take 1 tablet by mouth daily.       No Known  Allergies  Social History        Social History  . Marital status: Single    Spouse name: N/A  . Number of children: N/A  . Years of education: N/A      Occupational History  . Not on file.         Social History Main Topics  . Smoking status: Never Smoker  . Smokeless tobacco: Never Used  . Alcohol use No  . Drug use: No  . Sexual activity: Yes    Birth control/ protection: Condom, Other-see comments     Comment: Pregnant       Other Topics Concern  . Not on file   Social History Narrative  . No narrative on file   Family History: denies history of gyn cancer  Physical Exam: BP 125/74 (BP Location: Left Arm)   Pulse 97   Temp 98.4 F (36.9 C) (Oral)   Resp 18   Ht 5' 4" (1.626 m)   Wt 211 lb (95.7 kg)   LMP 05/23/2016   BMI 36.22 kg/m   Gen: NAD CV: RRR Pulm: CTAB Abd; Gravid, NT MSK; ttp along bilateral gluteal area just below iliac crest.  Some mild inguinal tenderness.   Pelvic: 1cm per RN Ext: no e/c/t  Consults: None  Significant Findings/ Diagnostic Studies: none  Procedures: NST Baseline FHR: 140 beats/min Variability: moderate Accelerations: present Decelerations: absent Tocometry: intermittent, not frequent  Interpretation:  INDICATIONS: back pain pregnancy RESULTS:  A NST procedure was performed with FHR   monitoring and a normal baseline established, appropriate time of 20-40 minutes of evaluation, and accels >2 seen w 15x15 characteristics.  Results show a REACTIVE NST.    Discharge Condition: stable  Disposition: 01-Home or Self Care  Diet: Regular diet  Discharge Activity: Activity as tolerated  Allergies as of 02/16/2017   No Known Allergies             Medication List     TAKE these medications   aspirin EC 81 MG tablet Take 81 mg by mouth daily.   cyclobenzaprine 10 MG tablet Commonly known as:  FLEXERIL Take 1 tablet (10 mg total) by mouth 3 (three) times daily as needed for muscle  spasms.   PRENATAL VITAMIN PO Take 1 tablet by mouth daily.       Total time spent taking care of this patient: 25 minutes  Signed: Tishara Pizano, MD  02/16/2017, 7:33 PM      

## 2017-02-16 NOTE — OB Triage Note (Signed)
Walking when lower back started hurting. Noticed pain approx noon today worsening around 1600 when she desired to be evaluated in the hospital. CO constant lower back pain that radiated to lower abdomen. Denies urinary s/s. Denies any LOF, vaginal bleeding. Paula Castro

## 2017-05-22 ENCOUNTER — Encounter (HOSPITAL_COMMUNITY): Payer: Self-pay

## 2017-10-07 ENCOUNTER — Encounter: Payer: Self-pay | Admitting: Emergency Medicine

## 2017-10-07 ENCOUNTER — Other Ambulatory Visit: Payer: Self-pay

## 2017-10-07 ENCOUNTER — Emergency Department
Admission: EM | Admit: 2017-10-07 | Discharge: 2017-10-07 | Disposition: A | Discharge status: Home / Self Care | Payer: 59 | Attending: Emergency Medicine | Admitting: Emergency Medicine

## 2017-10-07 DIAGNOSIS — R05 Cough: Secondary | ICD-10-CM | POA: Diagnosis present

## 2017-10-07 DIAGNOSIS — R69 Illness, unspecified: Secondary | ICD-10-CM

## 2017-10-07 DIAGNOSIS — Z7982 Long term (current) use of aspirin: Secondary | ICD-10-CM | POA: Diagnosis not present

## 2017-10-07 DIAGNOSIS — J111 Influenza due to unidentified influenza virus with other respiratory manifestations: Secondary | ICD-10-CM | POA: Diagnosis not present

## 2017-10-07 MED ORDER — IBUPROFEN 800 MG PO TABS: 800.0000 mg | ORAL_TABLET | Freq: Three times a day (TID) | ORAL | 0 refills | Status: DC | PRN

## 2017-10-07 MED ORDER — IBUPROFEN 800 MG PO TABS
800.0000 mg | ORAL_TABLET | Freq: Once | ORAL | Status: AC
  Administered 2017-10-07: 800 mg via ORAL
  Filled 2017-10-07: qty 1

## 2017-10-07 MED ORDER — OSELTAMIVIR PHOSPHATE 75 MG PO CAPS
75.0000 mg | ORAL_CAPSULE | Freq: Once | ORAL | Status: AC
  Administered 2017-10-07: 75 mg via ORAL
  Filled 2017-10-07: qty 1

## 2017-10-07 MED ORDER — OSELTAMIVIR PHOSPHATE 75 MG PO CAPS: 75.0000 mg | ORAL_CAPSULE | Freq: Two times a day (BID) | ORAL | 0 refills | Status: AC

## 2017-10-07 MED ORDER — PROMETHAZINE-DM 6.25-15 MG/5ML PO SYRP: 5.0000 mL | ORAL_SOLUTION | Freq: Four times a day (QID) | ORAL | 0 refills | Status: DC | PRN

## 2017-10-07 NOTE — Discharge Instructions (Addendum)
Please make sure you are drinking lots of fluids.  Alternate Tylenol and ibuprofen as needed for fevers and body aches.  Continue with over-the-counter cough cold and decongestant medication as directed.  Return to the ER for any fevers above 101 that are not going down with Tylenol and ibuprofen, worsening cough, shortness of breath, worsening symptoms or urgent changes in health.

## 2017-10-07 NOTE — ED Notes (Signed)
Patient with complaint of cough and congestion that started on Sunday. Patient reports that she has a productive cough with green sputum.

## 2017-10-07 NOTE — ED Triage Notes (Signed)
Patient ambulatory to triage with steady gait, without difficulty or distress noted; pt reports since Sunday having prod cough green sputum; denies congestion, denies fever but st has had chills

## 2017-10-07 NOTE — ED Notes (Signed)

## 2017-10-07 NOTE — ED Provider Notes (Signed)
[facility] EMERGENCY DEPARTMENT Provider Note   CSN: [id number] Arrival date & time: 10/07/17  2135     History   Chief Complaint Chief Complaint  Patient presents with  . Cough    HPI Paula Castro is a 27 y.o. female.  Presents to the emergency department for evaluation of flulike symptoms.  Patient states she has had cough, subjective fevers, congestion, sore throat, runny nose, nausea, diarrhea for 2 days.  She has been taking TheraFlu today with mild improvement.  She denies any vomiting, abdominal pain, chest pain or shortness of breath.  No known contacts with strep or influenza.  Patient states she does have body aches and a mild headache.  She denies any neck pain or stiffness.  HPI  Past Medical History:  Diagnosis Date  . Environmental allergies   . Preeclampsia     Patient Active Problem List   Diagnosis Date Noted  . Labor and delivery, indication for care 02/16/2017  . Indication for care in labor and delivery, antepartum 02/01/2017  . GDM (gestational diabetes mellitus) 12/13/2016  . Supervision of high risk pregnancy, antepartum 12/12/2016  . History of severe pre-eclampsia 12/12/2016  . Vulvovaginal candidiasis 12/12/2016  . Short interval between pregnancies affecting pregnancy in third trimester, antepartum 12/12/2016  . Obesity in pregnancy, antepartum, third trimester 12/12/2016  . Obesity, Class II, BMI 35-39.9 12/12/2016    Past Surgical History:  Procedure Laterality Date  . TONSILLECTOMY  2004     OB History    Gravida  2   Para  1   Term  0   Preterm  1   AB  0   Living  1     SAB  0   TAB  0   Ectopic  0   Multiple  0   Live Births  1            Home Medications    Prior to Admission medications   Medication Sig Start Date End Date Taking? Authorizing Provider  aspirin EC 81 MG tablet Take 81 mg by mouth daily.    [provider]  cyclobenzaprine (FLEXERIL) 10 MG tablet Take 1  tablet (10 mg total) by mouth 3 (three) times daily as needed for muscle spasms. 02/16/17   Conard Novak, MD  ibuprofen (ADVIL,MOTRIN) 800 MG tablet Take 1 tablet (800 mg total) by mouth every 8 (eight) hours as needed. 10/07/17   Evon Slack, PA-C  oseltamivir (TAMIFLU) 75 MG capsule Take 1 capsule (75 mg total) by mouth 2 (two) times daily for 5 days. 10/07/17 10/12/17  Evon Slack, PA-C  Prenatal Vit-Fe Fumarate-FA (PRENATAL VITAMIN PO) Take 1 tablet by mouth daily.     [provider]  promethazine-dextromethorphan (PROMETHAZINE-DM) 6.25-15 MG/5ML syrup Take 5 mLs by mouth 4 (four) times daily as needed for cough. 10/07/17   Evon Slack, PA-C    Family History No family history on file.  Social History Social History   Tobacco Use  . Smoking status: Never Smoker  . Smokeless tobacco: Never Used  Substance Use Topics  . Alcohol use: No  . Drug use: No     Allergies   Patient has no known allergies.   Review of Systems Review of Systems  Constitutional: Positive for fever (.  Subjective).  HENT: Positive for congestion, rhinorrhea and sore throat. Negative for ear discharge, sinus pressure, sinus pain, trouble swallowing and voice change.   Respiratory: Positive for cough.  Negative for shortness of breath, wheezing and stridor.   Cardiovascular: Negative for chest pain.  Gastrointestinal: Negative for abdominal pain, diarrhea, nausea and vomiting.  Genitourinary: Negative for dysuria, flank pain and pelvic pain.  Musculoskeletal: Positive for myalgias. Negative for back pain.  Skin: Negative for rash.  Neurological: Negative for dizziness and headaches.     Physical Exam Updated Vital Signs BP 124/78 (BP Location: Left Arm)   Pulse (!) 114   Temp 98.2 F (36.8 C) (Oral)   Resp 20   Ht 5\' 4"  (1.626 m)   Wt 81.6 kg (180 lb)   LMP 09/16/2017 (Exact Date)   SpO2 100%   BMI 30.90 kg/m   Physical Exam  Constitutional: She is oriented to person,  place, and time. She appears well-developed and well-nourished. No distress.  HENT:  Head: Normocephalic and atraumatic.  Right Ear: Hearing, tympanic membrane, external ear and ear canal normal.  Left Ear: Hearing, tympanic membrane, external ear and ear canal normal.  Nose: Rhinorrhea present.  Mouth/Throat: Mucous membranes are normal. No trismus in the jaw. No uvula swelling. Posterior oropharyngeal erythema present. No oropharyngeal exudate, posterior oropharyngeal edema or tonsillar abscesses. No tonsillar exudate.  No pharyngeal erythema or exudates.  Uvula is midline.  Positive posterior cervical lymphadenopathy with no tender anterior cervical lymphadenopathy.  Eyes: Conjunctivae are normal. Right eye exhibits no discharge. Left eye exhibits no discharge.  Neck: Normal range of motion.  Cardiovascular: Normal rate and regular rhythm.  Pulmonary/Chest: Effort normal and breath sounds normal. No stridor. No respiratory distress. She has no wheezes. She has no rales.  Abdominal: Soft. She exhibits no distension. There is no tenderness.  Musculoskeletal: Normal range of motion. She exhibits no deformity.  Lymphadenopathy:    She has cervical adenopathy.  Neurological: She is alert and oriented to person, place, and time. She has normal reflexes.  Skin: Skin is warm and dry.  Psychiatric: She has a normal mood and affect. Her behavior is normal. Thought content normal.     ED Treatments / Results  Labs (all labs ordered are listed, but only abnormal results are displayed) Labs Reviewed - No data to display  EKG None  Radiology No results found.  Procedures Procedures (including critical care time)  Medications Ordered in ED Medications  oseltamivir (TAMIFLU) capsule 75 mg (75 mg Oral Given 10/07/17 2252)  ibuprofen (ADVIL,MOTRIN) tablet 800 mg (800 mg Oral Given 10/07/17 2252)     Initial Impression / Assessment and Plan / ED Course  I have reviewed the triage vital signs  and the nursing notes.  Pertinent labs & imaging results that were available during my care of the patient were reviewed by me and considered in my medical decision making (see chart for details).     27 year old female with 2-day history of flulike symptoms.  Vital signs within normal limits.  No signs of respiratory distress, lungs are clear to auscultation.  Patient started on Tamiflu.  She will increase fluids.  She is given prescription for nighttime cough medication.  She will continue with over-the-counter cough and decongestant medication.  Patient educated on signs and symptoms return to the ED for.  Final Clinical Impressions(s) / ED Diagnoses   Final diagnoses:  Influenza-like illness    ED Discharge Orders        Ordered    ibuprofen (ADVIL,MOTRIN) 800 MG tablet  Every 8 hours PRN     10/07/17 2325    oseltamivir (TAMIFLU) 75 MG capsule  2 times  daily     10/07/17 2325    promethazine-dextromethorphan (PROMETHAZINE-DM) 6.25-15 MG/5ML syrup  4 times daily PRN     10/07/17 2325       Evon Slack, PA-C 10/07/17 2343    [name], MD 10/10/17 [id number]

## 2017-11-04 ENCOUNTER — Other Ambulatory Visit: Payer: Self-pay

## 2017-11-04 ENCOUNTER — Emergency Department
Admission: EM | Admit: 2017-11-04 | Discharge: 2017-11-04 | Disposition: A | Discharge status: Home / Self Care | Payer: 59 | Attending: Emergency Medicine | Admitting: Emergency Medicine

## 2017-11-04 ENCOUNTER — Encounter: Payer: Self-pay | Admitting: Emergency Medicine

## 2017-11-04 ENCOUNTER — Emergency Department: Payer: 59

## 2017-11-04 DIAGNOSIS — J069 Acute upper respiratory infection, unspecified: Secondary | ICD-10-CM | POA: Insufficient documentation

## 2017-11-04 DIAGNOSIS — R509 Fever, unspecified: Secondary | ICD-10-CM | POA: Diagnosis present

## 2017-11-04 DIAGNOSIS — Z7982 Long term (current) use of aspirin: Secondary | ICD-10-CM | POA: Diagnosis not present

## 2017-11-04 MED ORDER — METHYLPREDNISOLONE 4 MG PO TBPK: ORAL_TABLET | ORAL | 0 refills | Status: AC

## 2017-11-04 MED ORDER — AZITHROMYCIN 250 MG PO TABS: ORAL_TABLET | ORAL | 0 refills | Status: AC

## 2017-11-04 NOTE — ED Provider Notes (Signed)
[facility] Emergency Department Provider Note  ____________________________________________   First MD Initiated Contact with Patient 11/04/17 1458     (approximate)  I have reviewed the triage vital signs and the nursing notes.   HISTORY  Chief Complaint URI    HPI Paula Castro is a 27 y.o. female since emergency department complaining of upper respiratory symptoms.  She states she was diagnosed with the flu about 1 month ago.  She is continued to have low-grade fever and feel achy.  She states that when she coughs her chest hurts.  She states she is not sure if it is just her anxiety or if she is actually getting worse.  She denies any burning with urination.  She denies any vomiting or diarrhea.  Past Medical History:  Diagnosis Date  . Environmental allergies   . Preeclampsia     Patient Active Problem List   Diagnosis Date Noted  . Labor and delivery, indication for care 02/16/2017  . Indication for care in labor and delivery, antepartum 02/01/2017  . GDM (gestational diabetes mellitus) 12/13/2016  . Supervision of high risk pregnancy, antepartum 12/12/2016  . History of severe pre-eclampsia 12/12/2016  . Vulvovaginal candidiasis 12/12/2016  . Short interval between pregnancies affecting pregnancy in third trimester, antepartum 12/12/2016  . Obesity in pregnancy, antepartum, third trimester 12/12/2016  . Obesity, Class II, BMI 35-39.9 12/12/2016    Past Surgical History:  Procedure Laterality Date  . TONSILLECTOMY  2004    Prior to Admission medications   Medication Sig Start Date End Date Taking? Authorizing Provider  aspirin EC 81 MG tablet Take 81 mg by mouth daily.    [provider]  azithromycin (ZITHROMAX Z-PAK) 250 MG tablet 2 pills today then 1 pill a day for 4 days 11/04/17   Sherrie Mustache Roselyn Bering, PA-C  methylPREDNISolone (MEDROL DOSEPAK) 4 MG TBPK tablet Take 6 pills on day one then decrease by 1 pill each day 11/04/17    Faythe Ghee, PA-C    Allergies Patient has no known allergies.  History reviewed. No pertinent family history.  Social History Social History   Tobacco Use  . Smoking status: Never Smoker  . Smokeless tobacco: Never Used  Substance Use Topics  . Alcohol use: No  . Drug use: No    Review of Systems  Constitutional: Positive fever/chills Eyes: No visual changes. ENT: No sore throat. Respiratory: Positive cough Genitourinary: Negative for dysuria. Musculoskeletal: Negative for back pain. Skin: Negative for rash.    ____________________________________________   PHYSICAL EXAM:  VITAL SIGNS: ED Triage Vitals  Enc Vitals Group     BP 11/04/17 1434 (!) 162/95     Pulse Rate 11/04/17 1434 96     Resp 11/04/17 1434 16     Temp 11/04/17 1434 99.3 F (37.4 C)     Temp Source 11/04/17 1434 Oral     SpO2 11/04/17 1434 100 %     Weight 11/04/17 1436 190 lb (86.2 kg)     Height 11/04/17 1436 5\' 4"  (1.626 m)     Head Circumference --      Peak Flow --      Pain Score 11/04/17 1435 10     Pain Loc --      Pain Edu? --      Excl. in GC? --     Constitutional: Alert and oriented. Well appearing and in no acute distress. Eyes: Conjunctivae are normal.  Head: Atraumatic. Nose: No congestion/rhinnorhea. Mouth/Throat: Mucous  membranes are moist.   Cardiovascular: Normal rate, regular rhythm.  Heart sounds normal Respiratory: Normal respiratory effort.  No retractions, lungs are clear to auscultation GU: deferred Musculoskeletal: FROM all extremities, warm and well perfused Neurologic:  Normal speech and language.  Skin:  Skin is warm, dry and intact. No rash noted. Psychiatric: Mood and affect are normal. Speech and behavior are normal.  ____________________________________________   LABS (all labs ordered are listed, but only abnormal results are displayed)  Labs Reviewed - No data to  display ____________________________________________   ____________________________________________  RADIOLOGY  Chest x-ray is normal  ____________________________________________   PROCEDURES  Procedure(s) performed: No  Procedures    ____________________________________________   INITIAL IMPRESSION / ASSESSMENT AND PLAN / ED COURSE  Pertinent labs & imaging results that were available during my care of the patient were reviewed by me and considered in my medical decision making (see chart for details).  Patient is 27 year old female presents emergency department complaining of cough and congestion.  Symptoms started about 1 month ago when she was diagnosed with flu.  She states that she got a little bit better than a few days later the symptoms returned and she is felt worse.  She states she is coughing up some mucus.  She denies chest pain.  She states she does feel little winded at times.  On physical exam patient appears well and nontoxic.  Lungs are clear to auscultation.  Remainder the exam is benign  Diagnosis is acute upper respiratory infection.  Explained the findings to the patient.  She was given a prescription for Z-Pak and steroid pack.  She is to follow-up with her regular doctor if not better in 3 days.  Return emergency department if worsening.  Take Tylenol and ibuprofen as needed for fever or pain.  She was discharged in stable condition.     As part of my medical decision making, I reviewed the following data within the electronic MEDICAL RECORD NUMBER Nursing notes reviewed and incorporated, Old chart reviewed, Radiograph reviewed chest x-ray is negative, Notes from prior ED visits and [place] Controlled Substance Database  ____________________________________________   FINAL CLINICAL IMPRESSION(S) / ED DIAGNOSES  Final diagnoses:  Acute upper respiratory infection      NEW MEDICATIONS STARTED DURING THIS VISIT:  Discharge Medication List as of  11/04/2017  3:36 PM    START taking these medications   Details  azithromycin (ZITHROMAX Z-PAK) 250 MG tablet 2 pills today then 1 pill a day for 4 days, Print    methylPREDNISolone (MEDROL DOSEPAK) 4 MG TBPK tablet Take 6 pills on day one then decrease by 1 pill each day, Print         Note:  This document was prepared using Dragon voice recognition software and may include unintentional dictation errors.    Faythe Ghee, PA-C 11/04/17 1559    Sharman Cheek, MD 11/05/17 2241

## 2017-11-04 NOTE — ED Triage Notes (Signed)
Pt to ED from home c/o productive cough, nasal congestion, sore throat for most of this month.  Dx with the flu 10/08/17 was feeling better and then symptoms returned.  Patient with steady gait, chest rise even and unlabored, skin warm and dry.

## 2017-11-04 NOTE — ED Notes (Signed)
See triage note.  Presents with cough and congestion  States sx's started about 1 month  Low grade fever on arrival

## 2017-11-04 NOTE — Discharge Instructions (Addendum)
Follow-up with your regular doctor the acute care if not better in 3 to 5 days.  Return emergency department if worsening.  Use medication as prescribed. °

## 2017-11-09 ENCOUNTER — Emergency Department
Admission: EM | Admit: 2017-11-09 | Discharge: 2017-11-09 | Disposition: A | Discharge status: Home / Self Care | Payer: 59 | Attending: Emergency Medicine | Admitting: Emergency Medicine

## 2017-11-09 ENCOUNTER — Other Ambulatory Visit: Payer: Self-pay

## 2017-11-09 DIAGNOSIS — R42 Dizziness and giddiness: Secondary | ICD-10-CM | POA: Insufficient documentation

## 2017-11-09 DIAGNOSIS — R51 Headache: Secondary | ICD-10-CM | POA: Diagnosis not present

## 2017-11-09 DIAGNOSIS — R519 Headache, unspecified: Secondary | ICD-10-CM

## 2017-11-09 DIAGNOSIS — I10 Essential (primary) hypertension: Secondary | ICD-10-CM | POA: Insufficient documentation

## 2017-11-09 DIAGNOSIS — Z7982 Long term (current) use of aspirin: Secondary | ICD-10-CM | POA: Insufficient documentation

## 2017-11-09 LAB — URINALYSIS, COMPLETE (UACMP) WITH MICROSCOPIC
BACTERIA UA: NONE SEEN
BILIRUBIN URINE: NEGATIVE
GLUCOSE, UA: NEGATIVE mg/dL
HGB URINE DIPSTICK: NEGATIVE
Ketones, ur: NEGATIVE mg/dL
LEUKOCYTES UA: NEGATIVE
NITRITE: NEGATIVE
Protein, ur: NEGATIVE mg/dL
Specific Gravity, Urine: 1.016 (ref 1.005–1.030)
Squamous Epithelial / LPF: NONE SEEN (ref 0–5)
pH: 7 (ref 5.0–8.0)

## 2017-11-09 LAB — BASIC METABOLIC PANEL
ANION GAP: 8 (ref 5–15)
BUN: 6 mg/dL (ref 6–20)
CO2: 25 mmol/L (ref 22–32)
Calcium: 8.7 mg/dL — ABNORMAL LOW (ref 8.9–10.3)
Chloride: 104 mmol/L (ref 101–111)
Creatinine, Ser: 0.64 mg/dL (ref 0.44–1.00)
GFR calc Af Amer: >60 mL/min (ref >60)
Glucose, Bld: 105 mg/dL — ABNORMAL HIGH (ref 65–99)
POTASSIUM: 3.3 mmol/L — AB (ref 3.5–5.1)
SODIUM: 137 mmol/L (ref 135–145)

## 2017-11-09 LAB — POCT PREGNANCY, URINE: PREG TEST UR: NEGATIVE

## 2017-11-09 LAB — CBC
HEMATOCRIT: 38.9 % (ref 35.0–47.0)
HEMOGLOBIN: 13.5 g/dL (ref 12.0–16.0)
MCH: 31.6 pg (ref 26.0–34.0)
MCHC: 34.7 g/dL (ref 32.0–36.0)
MCV: 91.2 fL (ref 80.0–100.0)
Platelets: 214 10*3/uL (ref 150–440)
RBC: 4.26 MIL/uL (ref 3.80–5.20)
RDW: 13.1 % (ref 11.5–14.5)
WBC: 5.8 10*3/uL (ref 3.6–11.0)

## 2017-11-09 NOTE — Discharge Instructions (Addendum)
Your workup in the Emergency Department today was reassuring.  We did not find any specific abnormalities.  We recommend you drink plenty of fluids such as low calorie Gatorade and/or water.  As we discussed, we think a lot of the stress that you have been under lately as well as your fast food (and high sodium) diet are contributing both to feeling bad in general, having the dizziness, and elevating your blood pressure.  If you can focus on better and more healthy eating habits including less sodium in your diet and drinking more water or other low calorie fluids to stay hydrated, we think that you will start feeling better fairly quickly.  We provided a phone number free to call to get established with a primary care provider.  Return to the Emergency Department if you develop new or worsening symptoms that concern you.

## 2017-11-09 NOTE — ED Triage Notes (Signed)
Patient reports feeling dizzy and having a headache.
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
## 2018-05-31 ENCOUNTER — Other Ambulatory Visit: Payer: Self-pay

## 2018-05-31 ENCOUNTER — Emergency Department
Admission: EM | Admit: 2018-05-31 | Discharge: 2018-05-31 | Disposition: A | Discharge status: Home / Self Care | Payer: 59 | Attending: Emergency Medicine | Admitting: Emergency Medicine

## 2018-05-31 DIAGNOSIS — T7840XA Allergy, unspecified, initial encounter: Secondary | ICD-10-CM

## 2018-05-31 DIAGNOSIS — R202 Paresthesia of skin: Secondary | ICD-10-CM | POA: Insufficient documentation

## 2018-05-31 DIAGNOSIS — R200 Anesthesia of skin: Secondary | ICD-10-CM | POA: Diagnosis not present

## 2018-05-31 DIAGNOSIS — Z7982 Long term (current) use of aspirin: Secondary | ICD-10-CM | POA: Insufficient documentation

## 2018-05-31 DIAGNOSIS — R21 Rash and other nonspecific skin eruption: Secondary | ICD-10-CM | POA: Diagnosis present

## 2018-05-31 MED ORDER — DIPHENHYDRAMINE HCL 25 MG PO CAPS
50.0000 mg | ORAL_CAPSULE | Freq: Once | ORAL | Status: AC
Start: 2018-05-31 — End: 2018-05-31
  Administered 2018-05-31: 50 mg via ORAL
  Filled 2018-05-31: qty 2

## 2018-05-31 MED ORDER — PREDNISONE 20 MG PO TABS
60.0000 mg | ORAL_TABLET | Freq: Once | ORAL | Status: AC
  Administered 2018-05-31: 60 mg via ORAL
  Filled 2018-05-31: qty 3

## 2018-05-31 MED ORDER — PREDNISONE 50 MG PO TABS: 50.0000 mg | ORAL_TABLET | Freq: Every day | ORAL | 0 refills | Status: AC

## 2018-05-31 MED ORDER — FAMOTIDINE 20 MG PO TABS
40.0000 mg | ORAL_TABLET | Freq: Once | ORAL | Status: AC
  Administered 2018-05-31: 40 mg via ORAL
  Filled 2018-05-31: qty 2

## 2018-05-31 MED ORDER — CETIRIZINE HCL 10 MG PO TABS: 10.0000 mg | ORAL_TABLET | Freq: Four times a day (QID) | ORAL | 0 refills | Status: AC

## 2018-05-31 MED ORDER — ADULT MULTIVITAMIN W/MINERALS CH
1.0000 | ORAL_TABLET | Freq: Once | ORAL | Status: AC
  Administered 2018-05-31: 1 via ORAL
  Filled 2018-05-31: qty 1

## 2018-05-31 NOTE — ED Triage Notes (Signed)
Patient reports tingling in hands and feet that comes and goes since Friday.  Also noticed rash above her eyes earlier (no obvious rash at this time).

## 2018-05-31 NOTE — ED Notes (Signed)
During triage patient noted to be texting on phone without difficulty or distress noted.

## 2018-05-31 NOTE — ED Provider Notes (Signed)
[facility] Emergency Department Provider Note  ____________________________________________   First MD Initiated Contact with Patient 05/31/18 [phone]     (approximate)  I have reviewed the triage vital signs and the nursing notes.   HISTORY  Chief Complaint Numbness and Rash   HPI Paula Castro is a 27 y.o. female who self presents to the emergency department with itching rash across her forearms and across the upper left side of her face that began 2 days ago.  She feels like the rash is largely resolved however earlier today she had numbness and tingling in her hands and feet which prompted the visit.  She has no new exposures.  No chest pain or shortness of breath.  No known allergies.  No sick contacts at home.  No headache.    Past Medical History:  Diagnosis Date  . Environmental allergies   . Preeclampsia     Patient Active Problem List   Diagnosis Date Noted  . Labor and delivery, indication for care 02/16/2017  . Indication for care in labor and delivery, antepartum 02/01/2017  . GDM (gestational diabetes mellitus) 12/13/2016  . Supervision of high risk pregnancy, antepartum 12/12/2016  . History of severe pre-eclampsia 12/12/2016  . Vulvovaginal candidiasis 12/12/2016  . Short interval between pregnancies affecting pregnancy in third trimester, antepartum 12/12/2016  . Obesity in pregnancy, antepartum, third trimester 12/12/2016  . Obesity, Class II, BMI 35-39.9 12/12/2016    Past Surgical History:  Procedure Laterality Date  . TONSILLECTOMY  2004    Prior to Admission medications   Medication Sig Start Date End Date Taking? Authorizing Provider  aspirin EC 81 MG tablet Take 81 mg by mouth daily.    [provider]  azithromycin (ZITHROMAX Z-PAK) 250 MG tablet 2 pills today then 1 pill a day for 4 days 11/04/17   Sherrie Mustache Roselyn Bering, PA-C  cetirizine (ZYRTEC) 10 MG tablet Take 1 tablet (10 mg total) by mouth 4 (four) times daily  for 7 days. 05/31/18 06/07/18  Merrily Brittle, MD  methylPREDNISolone (MEDROL DOSEPAK) 4 MG TBPK tablet Take 6 pills on day one then decrease by 1 pill each day 11/04/17   Faythe Ghee, PA-C  predniSONE (DELTASONE) 50 MG tablet Take 1 tablet (50 mg total) by mouth daily for 4 days. 05/31/18 06/04/18  Merrily Brittle, MD    Allergies Patient has no known allergies.  No family history on file.  Social History Social History   Tobacco Use  . Smoking status: Never Smoker  . Smokeless tobacco: Never Used  Substance Use Topics  . Alcohol use: No  . Drug use: No    Review of Systems Constitutional: No fever/chills Eyes: No visual changes. ENT: No sore throat. Cardiovascular: Denies chest pain. Respiratory: Denies shortness of breath. Gastrointestinal: No abdominal pain.  No nausea, no vomiting.  No diarrhea.  No constipation. Genitourinary: Negative for dysuria. Musculoskeletal: Negative for back pain. Skin: Positive for rash. Neurological: Negative for headaches   ____________________________________________   PHYSICAL EXAM:  VITAL SIGNS: ED Triage Vitals  Enc Vitals Group     BP 05/31/18 0112 (!) 150/80     Pulse Rate 05/31/18 0112 (!) 115     Resp 05/31/18 0112 18     Temp 05/31/18 0112 97.8 F (36.6 C)     Temp Source 05/31/18 0112 Oral     SpO2 05/31/18 0112 100 %     Weight 05/31/18 0113 194 lb (88 kg)     Height 05/31/18  0113 5\' 4"  (1.626 m)     Head Circumference --      Peak Flow --      Pain Score 05/31/18 0113 0     Pain Loc --      Pain Edu? --      Excl. in GC? --     Constitutional: Alert and oriented x4 pleasant cooperative speaks full clear sentences no diaphoresis Eyes: PERRL EOMI. Head: Atraumatic. Nose: No congestion/rhinnorhea. Mouth/Throat: No trismus Neck: No stridor.   Cardiovascular: Normal rate, regular rhythm. Grossly normal heart sounds.  Good peripheral circulation. Respiratory: Normal respiratory effort.  No retractions. Lungs  CTAB and moving good air Gastrointestinal: Soft nontender Musculoskeletal: No lower extremity edema   Neurologic:  Normal speech and language. No gross focal neurologic deficits are appreciated. Skin: Excoriations and faint blanching erythema across forearms and left upper face Psychiatric: Mood and affect are normal. Speech and behavior are normal.    ____________________________________________   DIFFERENTIAL includes but not limited to  Allergic reaction, bug bites, scabies, hyperventilation____________________________________________   LABS (all labs ordered are listed, but only abnormal results are displayed)  Labs Reviewed - No data to display   __________________________________________  EKG   ____________________________________________  RADIOLOGY   ____________________________________________   PROCEDURES  Procedure(s) performed: no  Procedures  Critical Care performed: no  ____________________________________________   INITIAL IMPRESSION / ASSESSMENT AND PLAN / ED COURSE  Pertinent labs & imaging results that were available during my care of the patient were reviewed by me and considered in my medical decision making (see chart for details).   As part of my medical decision making, I reviewed the following data within the electronic MEDICAL RECORD NUMBER History obtained from family if available, nursing notes, old chart and ekg, as well as notes from prior ED visits.  The patient arrives very well-appearing with excoriations and urticarial appearing rash.  Unclear nidus.  Given Benadryl and Pepcid along with prednisone here.  No evidence of anaphylaxis.  Her numbness and tingling is likely secondary to hyperventilation.  Given cetirizine and prednisone for home and primary care follow-up.      ____________________________________________   FINAL CLINICAL IMPRESSION(S) / ED DIAGNOSES  Final diagnoses:  Allergic reaction, initial encounter       NEW MEDICATIONS STARTED DURING THIS VISIT:  Discharge Medication List as of 05/31/2018  5:02 AM    START taking these medications   Details  cetirizine (ZYRTEC) 10 MG tablet Take 1 tablet (10 mg total) by mouth 4 (four) times daily for 7 days., Starting Sun 05/31/2018, Until Sun 06/07/2018, Print    predniSONE (DELTASONE) 50 MG tablet Take 1 tablet (50 mg total) by mouth daily for 4 days., Starting Sun 05/31/2018, Until Thu 06/04/2018, Print         Note:  This document was prepared using Dragon voice recognition software and may include unintentional dictation errors.     Merrily Brittle, MD 06/01/18 [phone]

## 2018-05-31 NOTE — Discharge Instructions (Addendum)
Benadryl is extremely effective for itching an allergies, but it tends to make you pretty sleepy.  Most dermatologists recommend over the counter Zyrtec (cetirizine) instead.  The bottle says to take it once a day, but I recommend taking it 4 times a day for the next week instead.  Please take all of your steroids as prescribed and follow up with primary care within 1 week for a recheck.    Return to the ED sooner for any concerns.  It was a pleasure to take care of you today, and thank you for coming to our emergency department.  If you have any questions or concerns before leaving please ask the nurse to grab me and I'm more than happy to go through your aftercare instructions again.  If you have any concerns once you are home that you are not improving or are in fact getting worse before you can make it to your follow-up appointment, please do not hesitate to call [phone] and come back for further evaluation.  Merrily Brittle, MD

## 2018-07-06 ENCOUNTER — Encounter: Payer: Self-pay | Admitting: *Deleted

## 2018-07-06 ENCOUNTER — Emergency Department
Admission: EM | Admit: 2018-07-06 | Discharge: 2018-07-06 | Disposition: A | Discharge status: Home / Self Care | Payer: 59 | Attending: Emergency Medicine | Admitting: Emergency Medicine

## 2018-07-06 ENCOUNTER — Other Ambulatory Visit: Payer: Self-pay

## 2018-07-06 DIAGNOSIS — H66002 Acute suppurative otitis media without spontaneous rupture of ear drum, left ear: Secondary | ICD-10-CM | POA: Diagnosis not present

## 2018-07-06 DIAGNOSIS — Z79899 Other long term (current) drug therapy: Secondary | ICD-10-CM | POA: Diagnosis not present

## 2018-07-06 DIAGNOSIS — Z7982 Long term (current) use of aspirin: Secondary | ICD-10-CM | POA: Diagnosis not present

## 2018-07-06 DIAGNOSIS — H9202 Otalgia, left ear: Secondary | ICD-10-CM | POA: Diagnosis present

## 2018-07-06 MED ORDER — AMOXICILLIN 875 MG PO TABS: 875.0000 mg | ORAL_TABLET | Freq: Two times a day (BID) | ORAL | 0 refills | Status: AC

## 2018-07-06 NOTE — ED Triage Notes (Signed)
Patient report left ear pain that has been present since last week. Patient c/o episodes of tingling and dizziness. Patient drove herself here. Patient has a steady gait.

## 2018-07-06 NOTE — ED Provider Notes (Signed)
[facility] Emergency Department Provider Note  ____________________________________________  Time seen: Approximately 9:08 PM  I have reviewed the triage vital signs and the nursing notes.   HISTORY  Chief Complaint Otalgia    HPI Paula Castro is a 27 y.o. female presents to the emergency department with left ear pain for approximately 1 week.  Patient has been afebrile at home.  She denies changes in hearing but reports that she has had some vertigo.  No discharge from the left ear or recent episodes of otitis media.  Patient reports viral URI like symptoms 2 weeks ago.  Patient has also had some tingling in the hands and feet.  She reports that she recently became a vegan approximately 1 month ago. No alleviating measures have been attempted.    Past Medical History:  Diagnosis Date  . Environmental allergies   . Preeclampsia     Patient Active Problem List   Diagnosis Date Noted  . Labor and delivery, indication for care 02/16/2017  . Indication for care in labor and delivery, antepartum 02/01/2017  . GDM (gestational diabetes mellitus) 12/13/2016  . Supervision of high risk pregnancy, antepartum 12/12/2016  . History of severe pre-eclampsia 12/12/2016  . Vulvovaginal candidiasis 12/12/2016  . Short interval between pregnancies affecting pregnancy in third trimester, antepartum 12/12/2016  . Obesity in pregnancy, antepartum, third trimester 12/12/2016  . Obesity, Class II, BMI 35-39.9 12/12/2016    Past Surgical History:  Procedure Laterality Date  . TONSILLECTOMY  2004    Prior to Admission medications   Medication Sig Start Date End Date Taking? Authorizing Provider  amoxicillin (AMOXIL) 875 MG tablet Take 1 tablet (875 mg total) by mouth 2 (two) times daily for 10 days. 07/06/18 07/16/18  Orvil Feil, PA-C  aspirin EC 81 MG tablet Take 81 mg by mouth daily.    [provider]  azithromycin (ZITHROMAX Z-PAK) 250 MG tablet 2  pills today then 1 pill a day for 4 days 11/04/17   Sherrie Mustache Roselyn Bering, PA-C  cetirizine (ZYRTEC) 10 MG tablet Take 1 tablet (10 mg total) by mouth 4 (four) times daily for 7 days. 05/31/18 06/07/18  Merrily Brittle, MD  methylPREDNISolone (MEDROL DOSEPAK) 4 MG TBPK tablet Take 6 pills on day one then decrease by 1 pill each day 11/04/17   Faythe Ghee, PA-C    Allergies Patient has no known allergies.  No family history on file.  Social History Social History   Tobacco Use  . Smoking status: Never Smoker  . Smokeless tobacco: Never Used  Substance Use Topics  . Alcohol use: No  . Drug use: No     Review of Systems  Constitutional: No fever/chills Eyes: No visual changes. No discharge ENT: Patient has left otalgia. Cardiovascular: no chest pain. Respiratory: no cough. No SOB. Gastrointestinal: No abdominal pain.  No nausea, no vomiting.  No diarrhea.  No constipation. Musculoskeletal: Negative for musculoskeletal pain. Skin: Negative for rash, abrasions, lacerations, ecchymosis. Neurological: Patient has had paresthesias of the hands and feet.   ____________________________________________   PHYSICAL EXAM:  VITAL SIGNS: ED Triage Vitals [07/06/18 1922]  Enc Vitals Group     BP (!) 142/74     Pulse Rate 95     Resp 16     Temp 98.4 F (36.9 C)     Temp Source Oral     SpO2 100 %     Weight 188 lb (85.3 kg)     Height 5\' 4"  (1.626  m)     Head Circumference      Peak Flow      Pain Score 8     Pain Loc      Pain Edu?      Excl. in GC?      Constitutional: Alert and oriented. Well appearing and in no acute distress. Eyes: Conjunctivae are normal. PERRL. EOMI. Head: Atraumatic. ENT:      Ears: Left tympanic membrane is erythematous and effused.  Right TM is effused as well.      Nose: No congestion/rhinnorhea.      Mouth/Throat: Mucous membranes are moist.  Neck: No stridor.  No cervical spine tenderness to palpation. Cardiovascular: Normal rate, regular  rhythm. Normal S1 and S2.  Good peripheral circulation. Respiratory: Normal respiratory effort without tachypnea or retractions. Lungs CTAB. Good air entry to the bases with no decreased or absent breath sounds. Gastrointestinal: Bowel sounds 4 quadrants. Soft and nontender to palpation. No guarding or rigidity. No palpable masses. No distention. No CVA tenderness. Musculoskeletal: Full range of motion to all extremities. No gross deformities appreciated. Neurologic:  Normal speech and language. No gross focal neurologic deficits are appreciated.  Skin:  Skin is warm, dry and intact. No rash noted. Psychiatric: Mood and affect are normal. Speech and behavior are normal. Patient exhibits appropriate insight and judgement.   ____________________________________________   LABS (all labs ordered are listed, but only abnormal results are displayed)  Labs Reviewed - No data to display ____________________________________________  EKG   ____________________________________________  RADIOLOGY  No results found.  ____________________________________________    PROCEDURES  Procedure(s) performed:    Procedures    Medications - No data to display   ____________________________________________   INITIAL IMPRESSION / ASSESSMENT AND PLAN / ED COURSE  Pertinent labs & imaging results that were available during my care of the patient were reviewed by me and considered in my medical decision making (see chart for details).  Review of the [place] CSRS was performed in accordance of the [facility] prior to dispensing any controlled drugs.      Assessment and plan Left-sided otitis media Vitamin B12 deficiency Patient presents to the emergency department with left ear pain for approximately 1 week.  On physical exam, left tympanic membrane was erythematous and effused.  Right tympanic membrane was also effused.  Patient was advised to use Flonase and was started on amoxicillin.  Patient  also described paresthesias of the hands and feet after becoming a vegan.  I advised that patient start supplementing with vitamin B12 as symptoms are consistent with vitamin B12 deficiency.  She was advised to follow-up with primary care as needed.  Strict return precautions were given to return with new or worsening symptoms.    ____________________________________________  FINAL CLINICAL IMPRESSION(S) / ED DIAGNOSES  Final diagnoses:  Acute suppurative otitis media of left ear without spontaneous rupture of tympanic membrane, recurrence not specified      NEW MEDICATIONS STARTED DURING THIS VISIT:  ED Discharge Orders         Ordered    amoxicillin (AMOXIL) 875 MG tablet  2 times daily     07/06/18 2107              This chart was dictated using voice recognition software/Dragon. Despite best efforts to proofread, errors can occur which can change the meaning. Any change was purely unintentional.    Orvil FeilWoods, Jaclyn M, PA-C 07/06/18 2112    Jeanmarie PlantMcShane, James A, MD 07/06/18 2312

## 2018-08-29 ENCOUNTER — Other Ambulatory Visit: Payer: Self-pay

## 2018-08-29 ENCOUNTER — Emergency Department
Admission: EM | Admit: 2018-08-29 | Discharge: 2018-08-29 | Disposition: A | Discharge status: Home / Self Care | Payer: 59 | Attending: Emergency Medicine | Admitting: Emergency Medicine

## 2018-08-29 ENCOUNTER — Encounter: Payer: Self-pay | Admitting: Emergency Medicine

## 2018-08-29 ENCOUNTER — Emergency Department: Payer: 59

## 2018-08-29 DIAGNOSIS — Z79899 Other long term (current) drug therapy: Secondary | ICD-10-CM | POA: Insufficient documentation

## 2018-08-29 DIAGNOSIS — R079 Chest pain, unspecified: Secondary | ICD-10-CM

## 2018-08-29 DIAGNOSIS — Z7982 Long term (current) use of aspirin: Secondary | ICD-10-CM | POA: Diagnosis not present

## 2018-08-29 DIAGNOSIS — R091 Pleurisy: Secondary | ICD-10-CM | POA: Diagnosis not present

## 2018-08-29 LAB — CBC
HCT: 40 % (ref 36.0–46.0)
Hemoglobin: 13.4 g/dL (ref 12.0–15.0)
MCH: 30.5 pg (ref 26.0–34.0)
MCHC: 33.5 g/dL (ref 30.0–36.0)
MCV: 90.9 fL (ref 80.0–100.0)
Platelets: 240 10*3/uL (ref 150–400)
RBC: 4.4 MIL/uL (ref 3.87–5.11)
RDW: 11.7 % (ref 11.5–15.5)
WBC: 6.5 10*3/uL (ref 4.0–10.5)
nRBC: 0 % (ref 0.0–0.2)

## 2018-08-29 LAB — BASIC METABOLIC PANEL
Anion gap: 10 (ref 5–15)
BUN: 7 mg/dL (ref 6–20)
CALCIUM: 9.4 mg/dL (ref 8.9–10.3)
CO2: 23 mmol/L (ref 22–32)
CREATININE: 0.72 mg/dL (ref 0.44–1.00)
Chloride: 104 mmol/L (ref 98–111)
GFR calc Af Amer: >60 mL/min (ref >60)
GLUCOSE: 101 mg/dL — AB (ref 70–99)
Potassium: 3.5 mmol/L (ref 3.5–5.1)
SODIUM: 137 mmol/L (ref 135–145)

## 2018-08-29 LAB — POCT PREGNANCY, URINE: Preg Test, Ur: NEGATIVE

## 2018-08-29 LAB — TROPONIN I

## 2018-08-29 LAB — FIBRIN DERIVATIVES D-DIMER (ARMC ONLY): FIBRIN DERIVATIVES D-DIMER (ARMC): 505.47 ng{FEU}/mL — AB (ref 0.00–499.00)

## 2018-08-29 MED ORDER — SODIUM CHLORIDE 0.9 % IV BOLUS
1000.0000 mL | Freq: Once | INTRAVENOUS | Status: AC
  Administered 2018-08-29: 1000 mL via INTRAVENOUS

## 2018-08-29 MED ORDER — IOHEXOL 350 MG/ML SOLN
75.0000 mL | Freq: Once | INTRAVENOUS | Status: AC | PRN
  Administered 2018-08-29: 75 mL via INTRAVENOUS

## 2018-08-29 MED ORDER — SODIUM CHLORIDE 0.9% FLUSH
3.0000 mL | Freq: Once | INTRAVENOUS | Status: AC
  Administered 2018-08-29: 3 mL via INTRAVENOUS

## 2018-08-29 NOTE — ED Notes (Signed)
Pt states she will use call bell to let this RN know when she can provide urine sample for preg test.

## 2018-08-29 NOTE — ED Notes (Signed)
CT notified by phone that urine preg is neg.

## 2018-08-29 NOTE — ED Notes (Signed)
Pt states "give me 10 minutes and I think I can provide a urine sample."

## 2018-08-29 NOTE — ED Triage Notes (Signed)
Pt to ER states diagnosed with Flu on 2/1 and has been having chest and shoulder pain intermittently since that time.  Also reports tachycardia with the pain.  States tachycardia relieves when pain relieves.  Pt states blood work EKG and chest xray since onset of symptoms.

## 2018-08-29 NOTE — Discharge Instructions (Addendum)
Take ibuprofen 600mg  3 times a day for the next 3-5 days. Take it with meals to prevent gastritis or ulcers. Follow up with [facility] clinic for further evaluation. Return to the ER for new or worsening chest pain, shortness of breath or fever.

## 2018-08-29 NOTE — ED Notes (Signed)
Urine preg neg.  

## 2018-08-29 NOTE — ED Provider Notes (Signed)
[facility] Emergency Department Provider Note  ____________________________________________  Time seen: Approximately 9:30 PM  I have reviewed the triage vital signs and the nursing notes.   HISTORY  Chief Complaint Chest Pain and Tachycardia   HPI Paula Castro is a 28 y.o. female with history of obesity and preeclampsia who presents for evaluation of chest pain.  Patient reports that she was diagnosed with flu 3 weeks ago.  Since then she has been having intermittent left-sided chest and shoulder pain that she describes as sharp, pleuritic, associated with mild shortness of breath. The pain has become constant for the last few days. Patient reports that she has noted intermittent episodes where her heart rate goes really high especially when the pain is bad and that usually resolves when the pain is better.  She has had no cough or congestion, no shortness of breath when she is asymptomatic, no personal family history of blood clots, recent travel immobilization, leg pain or swelling, hemoptysis, or exogenous hormones.  She is not a smoker.  No history of asthma.   Past Medical History:  Diagnosis Date  . Environmental allergies   . Preeclampsia     Patient Active Problem List   Diagnosis Date Noted  . Labor and delivery, indication for care 02/16/2017  . Indication for care in labor and delivery, antepartum 02/01/2017  . GDM (gestational diabetes mellitus) 12/13/2016  . Supervision of high risk pregnancy, antepartum 12/12/2016  . History of severe pre-eclampsia 12/12/2016  . Vulvovaginal candidiasis 12/12/2016  . Short interval between pregnancies affecting pregnancy in third trimester, antepartum 12/12/2016  . Obesity in pregnancy, antepartum, third trimester 12/12/2016  . Obesity, Class II, BMI 35-39.9 12/12/2016    Past Surgical History:  Procedure Laterality Date  . TONSILLECTOMY  2004    Prior to Admission medications   Medication Sig  Start Date End Date Taking? Authorizing Provider  aspirin EC 81 MG tablet Take 81 mg by mouth daily.    [provider]  azithromycin (ZITHROMAX Z-PAK) 250 MG tablet 2 pills today then 1 pill a day for 4 days 11/04/17   Sherrie Mustache Roselyn Bering, PA-C  cetirizine (ZYRTEC) 10 MG tablet Take 1 tablet (10 mg total) by mouth 4 (four) times daily for 7 days. 05/31/18 06/07/18  Merrily Brittle, MD  methylPREDNISolone (MEDROL DOSEPAK) 4 MG TBPK tablet Take 6 pills on day one then decrease by 1 pill each day 11/04/17   Faythe Ghee, PA-C    Allergies Patient has no known allergies.  FH Diabetes Brother    Hypertension Brother    Thyroid disease Maternal Grandmother      Social History Social History   Tobacco Use  . Smoking status: Never Smoker  . Smokeless tobacco: Never Used  Substance Use Topics  . Alcohol use: No  . Drug use: No    Review of Systems  Constitutional: Negative for fever. Eyes: Negative for visual changes. ENT: Negative for sore throat. Neck: No neck pain  Cardiovascular: + chest pain. Respiratory: + shortness of breath. Gastrointestinal: Negative for abdominal pain, vomiting or diarrhea. Genitourinary: Negative for dysuria. Musculoskeletal: Negative for back pain. Skin: Negative for rash. Neurological: Negative for headaches, weakness or numbness. Psych: No SI or HI  ____________________________________________   PHYSICAL EXAM:  VITAL SIGNS: ED Triage Vitals [08/29/18 1247]  Enc Vitals Group     BP 125/79     Pulse Rate (!) 118     Resp 20     Temp 98.1  F (36.7 C)     Temp src      SpO2 100 %     Weight 188 lb (85.3 kg)     Height 5\' 4"  (1.626 m)     Head Circumference      Peak Flow      Pain Score 8     Pain Loc      Pain Edu?      Excl. in GC?     Constitutional: Alert and oriented. Well appearing and in no apparent distress. HEENT:      Head: Normocephalic and atraumatic.         Eyes: Conjunctivae are normal. Sclera is  non-icteric.       Mouth/Throat: Mucous membranes are moist.       Neck: Supple with no signs of meningismus. Cardiovascular: Tachycardic with regular rhythm. No murmurs, gallops, or rubs. 2+ symmetrical distal pulses are present in all extremities. No JVD. Respiratory: Normal respiratory effort. Lungs are clear to auscultation bilaterally. No wheezes, crackles, or rhonchi.  Gastrointestinal: Soft, non tender, and non distended with positive bowel sounds. No rebound or guarding. Musculoskeletal: Nontender with normal range of motion in all extremities. No edema, cyanosis, or erythema of extremities. Neurologic: Normal speech and language. Face is symmetric. Moving all extremities. No gross focal neurologic deficits are appreciated. Skin: Skin is warm, dry and intact. No rash noted. Psychiatric: Mood and affect are normal. Speech and behavior are normal.  ____________________________________________   LABS (all labs ordered are listed, but only abnormal results are displayed)  Labs Reviewed  BASIC METABOLIC PANEL - Abnormal; Notable for the following components:      Result Value   Glucose, Bld 101 (*)    All other components within normal limits  FIBRIN DERIVATIVES D-DIMER ([facility] ONLY) - Abnormal; Notable for the following components:   Fibrin derivatives D-dimer ([facility]) 505.47 (*)    All other components within normal limits  CBC  TROPONIN I  POC URINE PREG, ED  POCT PREGNANCY, URINE   ____________________________________________  EKG  ED ECG REPORT I, Nita Sickle, the attending physician, personally viewed and interpreted this ECG.  Sinus tachycardia, rate of 118, normal intervals, normal axis, no ST elevations or depressions.  Otherwise normal EKG. ____________________________________________  RADIOLOGY  I have personally reviewed the images performed during this visit and I agree with the Radiologist's read.   Interpretation by Radiologist:  Dg Chest 2  View  Result Date: 08/29/2018 CLINICAL DATA:  Chest pain.  Cough. EXAM: CHEST - 2 VIEW COMPARISON:  11/04/2017 FINDINGS: Midline trachea. Normal heart size and mediastinal contours. No pleural effusion or pneumothorax. Clear lungs. IMPRESSION: No active cardiopulmonary disease. Electronically Signed   By: Jeronimo Greaves M.D.   On: 08/29/2018 13:36   Ct Angio Chest Pe W And/or Wo Contrast  Result Date: 08/29/2018 CLINICAL DATA:  Chest and shoulder pain since the patient was diagnosed with influenza 08/08/2018. EXAM: CT ANGIOGRAPHY CHEST WITH CONTRAST TECHNIQUE: Multidetector CT imaging of the chest was performed using the standard protocol during bolus administration of intravenous contrast. Multiplanar CT image reconstructions and MIPs were obtained to evaluate the vascular anatomy. CONTRAST:  75 mL OMNIPAQUE IOHEXOL 350 MG/ML SOLN COMPARISON:  PA and lateral chest 08/29/2018. FINDINGS: Cardiovascular: Satisfactory opacification of the pulmonary arteries to the segmental level. No evidence of pulmonary embolism. Normal heart size. No pericardial effusion. Bovine aortic arch incidentally noted. Mediastinum/Nodes: No enlarged mediastinal, hilar, or axillary lymph nodes. Thyroid gland, trachea, and esophagus demonstrate no significant  findings. Lungs/Pleura: Lungs are clear. No pleural effusion or pneumothorax. Upper Abdomen: Negative. Musculoskeletal: Negative. Review of the MIP images confirms the above findings. IMPRESSION: Negative for pulmonary embolus.  Negative chest CT. Electronically Signed   By: Drusilla Kanner M.D.   On: 08/29/2018 20:06     ____________________________________________   PROCEDURES  Procedure(s) performed: None Procedures Critical Care performed:  None ____________________________________________   INITIAL IMPRESSION / ASSESSMENT AND PLAN / ED COURSE   28 y.o. female with history of obesity and preeclampsia who presents for evaluation of left sided pleuritic chest pain  associated with SOB and tachycardia.  EKG showing sinus tachycardia with no evidence of ischemia.  Troponin x1-.  Since patient has had pain constantly for several days no need to repeat troponin.  Initial labs showing mildly elevated d-dimer.  Patient was sent for CT angiogram of the chest which was negative.  Heart rate improved with IV fluids.  Pregnancy negative with no evidence of preeclampsia.  Labs with no evidence of electrolyte abnormalities, dehydration, or anemia.  Presentation concerning for pleurisy.  Will discharge patient home on ibuprofen for the next 3 to 5 days.  Discussed standard return precautions and close follow-up with primary care doctor.      As part of my medical decision making, I reviewed the following data within the electronic MEDICAL RECORD NUMBER Nursing notes reviewed and incorporated, Labs reviewed , EKG interpreted , Old EKG reviewed, Old chart reviewed, Radiograph reviewed , Notes from prior ED visits and [place] Controlled Substance Database    Pertinent labs & imaging results that were available during my care of the patient were reviewed by me and considered in my medical decision making (see chart for details).    ____________________________________________   FINAL CLINICAL IMPRESSION(S) / ED DIAGNOSES  Final diagnoses:  Chest pain, unspecified type  Pleurisy      NEW MEDICATIONS STARTED DURING THIS VISIT:  ED Discharge Orders    None       Note:  This document was prepared using Dragon voice recognition software and may include unintentional dictation errors.    Don Perking, [place], MD 08/29/18 2135

## 2018-08-29 NOTE — ED Notes (Signed)
Pt attempting for urine sample now.  

## 2018-09-23 ENCOUNTER — Other Ambulatory Visit: Payer: Self-pay

## 2018-09-23 ENCOUNTER — Emergency Department
Admission: EM | Admit: 2018-09-23 | Discharge: 2018-09-23 | Disposition: A | Discharge status: Home / Self Care | Payer: 59 | Attending: Emergency Medicine | Admitting: Emergency Medicine

## 2018-09-23 DIAGNOSIS — R0789 Other chest pain: Secondary | ICD-10-CM | POA: Insufficient documentation

## 2018-09-23 DIAGNOSIS — R079 Chest pain, unspecified: Secondary | ICD-10-CM | POA: Diagnosis present

## 2018-09-23 MED ORDER — KETOROLAC TROMETHAMINE 10 MG PO TABS: 10.0000 mg | ORAL_TABLET | Freq: Three times a day (TID) | ORAL | 0 refills | Status: AC | PRN

## 2018-09-23 MED ORDER — KETOROLAC TROMETHAMINE 10 MG PO TABS
10.0000 mg | ORAL_TABLET | Freq: Once | ORAL | Status: DC
  Filled 2018-09-23: qty 1

## 2018-09-23 NOTE — ED Triage Notes (Signed)
Pt arrived POV from home with left shoulder pain. Pt was seen here about a month ago for the same pain.

## 2018-09-23 NOTE — Discharge Instructions (Addendum)
Please look up information about Ergon[employer] Positions for working with a computer and typing.  You may take Tylenol and Toradol, with food to prevent irritation of your stomach.  Do not take Advil, Motrin or other NSAID medications when you are taking Toradol.  Return to the emergency room for severe pain, lightheadedness or fainting, palpitations, shortness of breath, fever, or any other symptoms concerning to you.

## 2018-09-23 NOTE — ED Provider Notes (Signed)
[facility] Emergency Department Provider Note  ____________________________________________  Time seen: Approximately 11:29 AM  I have reviewed the triage vital signs and the nursing notes.   HISTORY  Chief Complaint Shoulder Pain    HPI Paula Castro is a 28 y.o. female with a hx of pre-eclampsia remotely presenting w/ ongoing L sided CP.  The pt reports that she sustained a fall 2/21 and was seen 2/22 for chest pain.  The has a pulling pain that has persisted since that time in the middle left chest that is worse with twisting, positional changes and when she is typing at the computer.  It is not worse with eating, deep breaths.  She has tried Tylenol, Motrin, Flexeril and Valium.  The only thing that has helped is Valium, and "only for 10 minutes."  She states she keeps calling her family in the middle of the night to ask them if she is having a heart attack.  She has seen her PCP, a chiropractor, and been in the ED for her symptoms.  PT has been recommended but she has not started this.  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  FH: no clots or early CAD.    Past Medical History:  Diagnosis Date  . Environmental allergies   . Preeclampsia     Patient Active Problem List   Diagnosis Date Noted  . Labor and delivery, indication for care 02/16/2017  . Indication for care in labor and delivery, antepartum 02/01/2017  . GDM (gestational diabetes mellitus) 12/13/2016  . Supervision of high risk pregnancy, antepartum 12/12/2016  . History of severe pre-eclampsia 12/12/2016  . Vulvovaginal candidiasis 12/12/2016  . Short interval between pregnancies affecting pregnancy in third trimester, antepartum 12/12/2016  . Obesity in pregnancy, antepartum, third trimester 12/12/2016  . Obesity, Class II, BMI 35-39.9 12/12/2016    Past Surgical History:  Procedure Laterality Date  .  TONSILLECTOMY  2004    Current Outpatient Rx  . Order #: [id number] Class: Historical Med  . Order #: [id number] Class: Print  . Order #: [id number] Class: Print  . Order #: [id number] Class: Print    Allergies Patient has no known allergies.  History reviewed. No pertinent family history.  Social History Social History   Tobacco Use  . Smoking status: Never Smoker  . Smokeless tobacco: Never Used  Substance Use Topics  . Alcohol use: No  . Drug use: No    Review of Systems Constitutional: No fever/chills. Eyes: No visual changes. ENT: No sore throat. No congestion or rhinorrhea. Cardiovascular: + chest pain. Denies palpitations. Respiratory: Denies shortness of breath.  No cough. Gastrointestinal: No abdominal pain.  No nausea, no vomiting.  No diarrhea.  No constipation. Genitourinary: Negative for dysuria. Musculoskeletal: Negative for back pain. Skin: Negative for rash. Neurological: Negative for headaches. No focal numbness, tingling or weakness.     ____________________________________________   PHYSICAL EXAM:  VITAL SIGNS: ED Triage Vitals  Enc Vitals Group     BP 09/23/18 1040 134/89     Pulse Rate 09/23/18 1040 (!) 125     Resp 09/23/18 1040 16     Temp 09/23/18 1040 98.4 F (36.9 C)     Temp Source 09/23/18 1040 Oral     SpO2 09/23/18 1040 100 %     Weight 09/23/18 1041 190 lb (86.2 kg)     Height 09/23/18 1041 5\' 4"  (1.626 m)     Head Circumference --  Peak Flow --      Pain Score 09/23/18 1041 10     Pain Loc --      Pain Edu? --      Excl. in GC? --     Constitutional: Alert and oriented. Well appearing and in no acute distress. Answers questions appropriately. Eyes: Conjunctivae are normal.  EOMI. No scleral icterus. Head: Atraumatic. Nose: No congestion/rhinnorhea. Mouth/Throat: Mucous membranes are moist.  Neck: No stridor.  Supple.  No JVD.   Cardiovascular: Normal rate, regular rhythm. No murmurs, rubs or gallops. On my examination,  HR 97 except when patient gets anxious and HR goes to 111.  No skin changes on the chest, no bruising, no crepitus. Respiratory: Normal respiratory effort.  No accessory muscle use or retractions. Lungs CTAB.  No wheezes, rales or ronchi.  No hypoxia on my exam. Gastrointestinal: Soft, nontender and nondistended.  No guarding or rebound.  No peritoneal signs. Musculoskeletal: No LE edema. No ttp in the calves or palpable cords.  Negative Homan's sign. Neurologic:  A&Ox3.  Speech is clear.  Face and smile are symmetric.  EOMI.  Moves all extremities well. Skin:  Skin is warm, dry and intact. No rash noted. Psychiatric: Anxious mood and affect  ____________________________________________   LABS (all labs ordered are listed, but only abnormal results are displayed)  Labs Reviewed - No data to display ____________________________________________  EKG  EKG interpretation by me: Performed at 10:38 with HR 115, normal axis, NSR, no STEMI, brugada syndrome, prolonged QTc or LVH. ____________________________________________  RADIOLOGY  No results found.  ____________________________________________   PROCEDURES  Procedure(s) performed: None  Procedures  Critical Care performed: No ____________________________________________   INITIAL IMPRESSION / ASSESSMENT AND PLAN / ED COURSE  Pertinent labs & imaging results that were available during my care of the patient were reviewed by me and considered in my medical decision making (see chart for details).  28 y.o. female with one month of ongoing CP worse with positions and exacerbated by typing.  Overall, the differential for this patient is unlikely to be due to an emergency condition.  EKG is reassuring w/o ischemia and pt had negative troponin at her last visit.  PE is very unlikely; pt is tachycardic intermittently but anxious, no hypoxia, no pleuritic pain, no LE swelling or evidence of DVT.  Do not suspect life threatening  infection, or aortic pathology.  Most likely msk pain exacerbated by anxiety; concern for poor ergonomics at home.  I have asked the patient to find information on-line about ergonomic positioning, and will try Toradol.  Consider occupational health, physical therapy.  Plan discharge at this time.  ____________________________________________  FINAL CLINICAL IMPRESSION(S) / ED DIAGNOSES  Final diagnoses:  Left-sided chest wall pain         NEW MEDICATIONS STARTED DURING THIS VISIT:  New Prescriptions   No medications on file      Rockne Menghini, MD 09/23/18 1150

## 2018-09-23 NOTE — ED Notes (Signed)
Signature pad not working. Discharge instructions given and pt educated on medications, follow up and verbalized understanding.

## 2018-10-13 ENCOUNTER — Other Ambulatory Visit: Payer: Self-pay | Admitting: Acute Care

## 2018-10-13 DIAGNOSIS — G35 Multiple sclerosis: Secondary | ICD-10-CM

## 2018-11-12 ENCOUNTER — Ambulatory Visit
Admission: RE | Admit: 2018-11-12 | Discharge: 2018-11-12 | Disposition: A | Discharge status: Home / Self Care | Payer: 59 | Source: Ambulatory Visit | Attending: Acute Care | Admitting: Acute Care

## 2018-11-12 ENCOUNTER — Ambulatory Visit: Admission: RE | Admit: 2018-11-12 | Payer: 59 | Source: Ambulatory Visit

## 2018-11-12 ENCOUNTER — Other Ambulatory Visit: Payer: Self-pay

## 2018-11-12 DIAGNOSIS — G35 Multiple sclerosis: Secondary | ICD-10-CM | POA: Diagnosis not present

## 2018-11-12 MED ORDER — GADOBUTROL 1 MMOL/ML IV SOLN
8.0000 mL | Freq: Once | INTRAVENOUS | Status: AC | PRN
  Administered 2018-11-12: 8 mL via INTRAVENOUS

## 2019-07-08 IMAGING — CT CT ANGIO CHEST
2 of 6 series · 19 of 46 positions shown · IV contrast (APPLIED)
Comparison: PA and lateral chest 08/29/2018.

CLINICAL DATA: Chest and shoulder pain since the patient was
diagnosed with influenza 08/08/2018.

EXAM:
CT ANGIOGRAPHY CHEST WITH CONTRAST
TECHNIQUE: Multidetector CT imaging of the chest was performed using the
standard protocol during bolus administration of intravenous
contrast. Multiplanar CT image reconstructions and MIPs were
obtained to evaluate the vascular anatomy.
CONTRAST:  75 mL OMNIPAQUE IOHEXOL 350 MG/ML SOLN

[Series 5: thins · axial · 0.65mm/px · z∈[-168,+64]mm · 16 of 256 slices shown]
[im 12/256  lung]
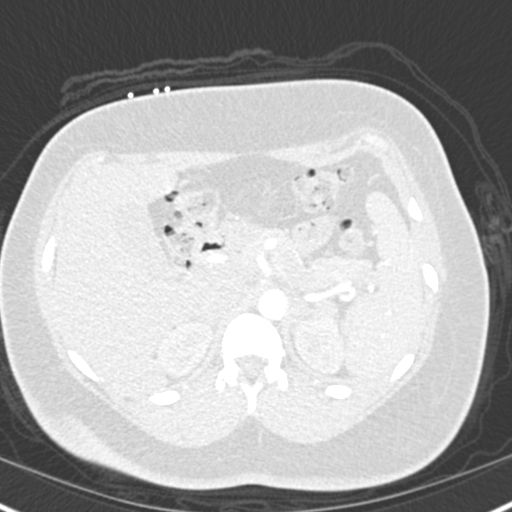
[im 34/256  soft-tissue]
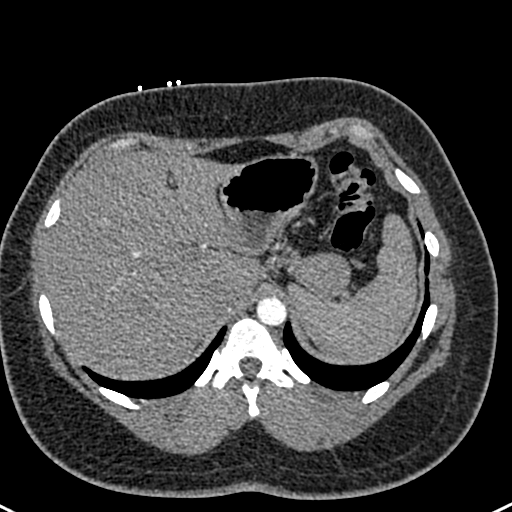
[im 45/256  lung]
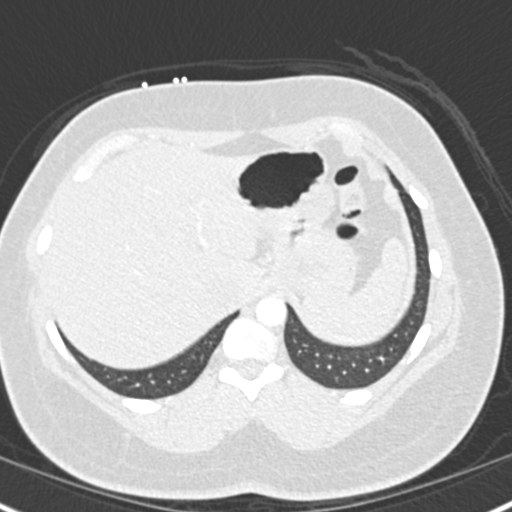
[im 56/256  soft-tissue]
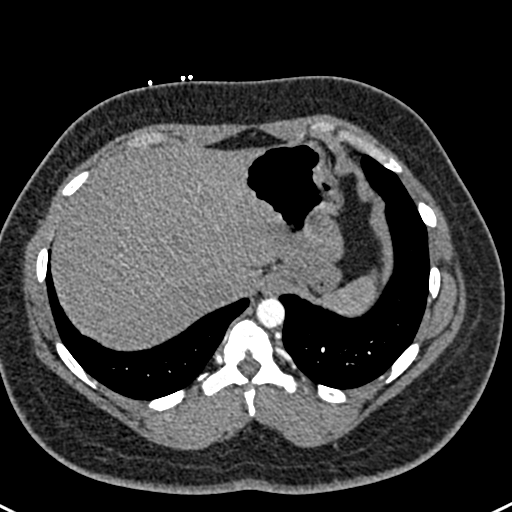
[im 78/256  lung]
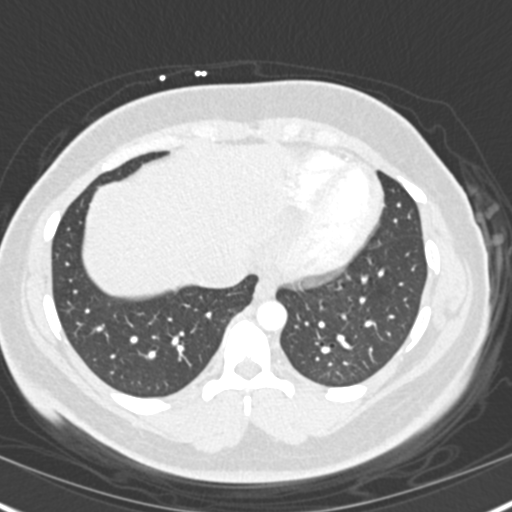
[im 89/256  soft-tissue]
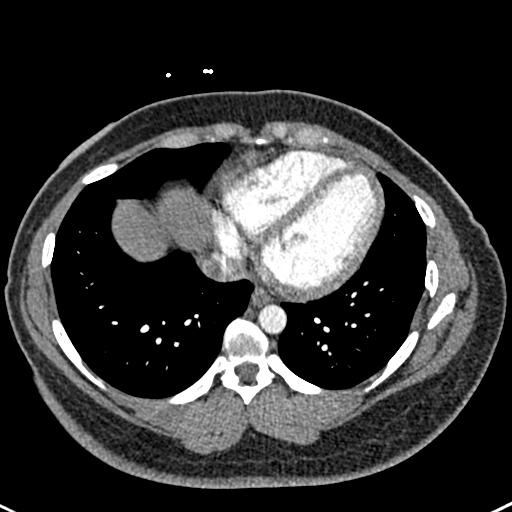
[im 100/256  lung]
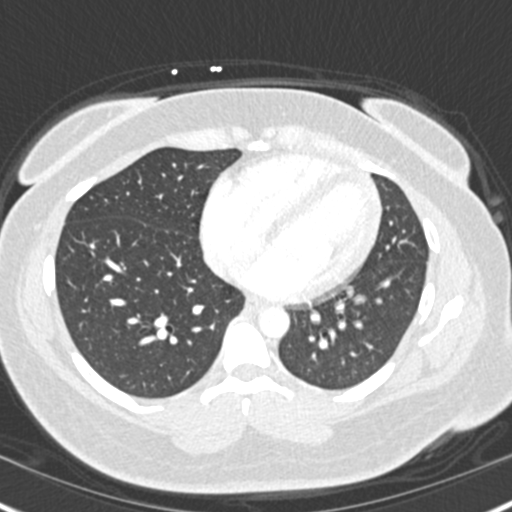
[im 122/256  soft-tissue]
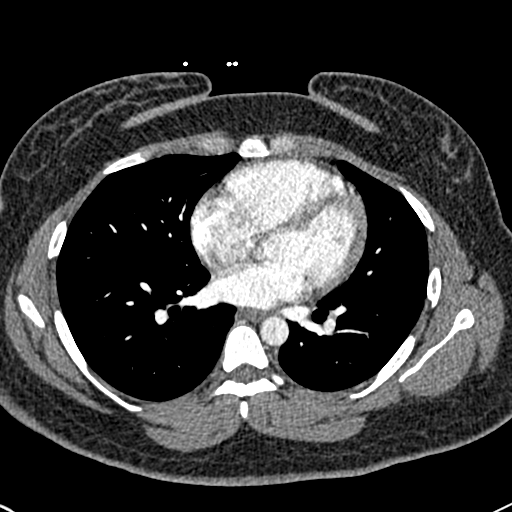
[im 134/256  lung]
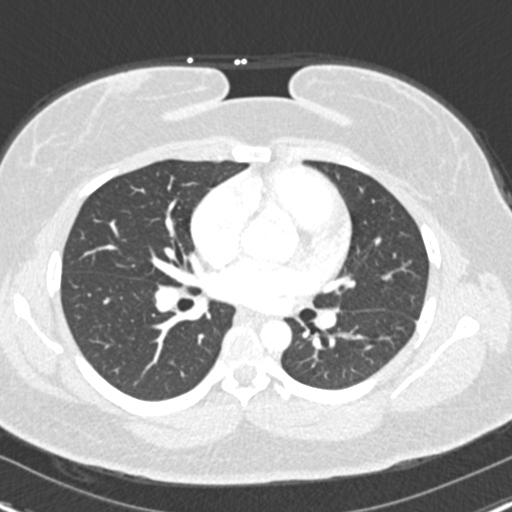
[im 156/256  soft-tissue]
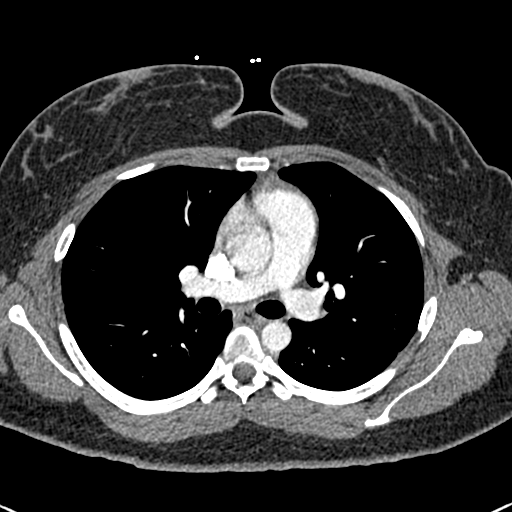
[im 167/256  lung]
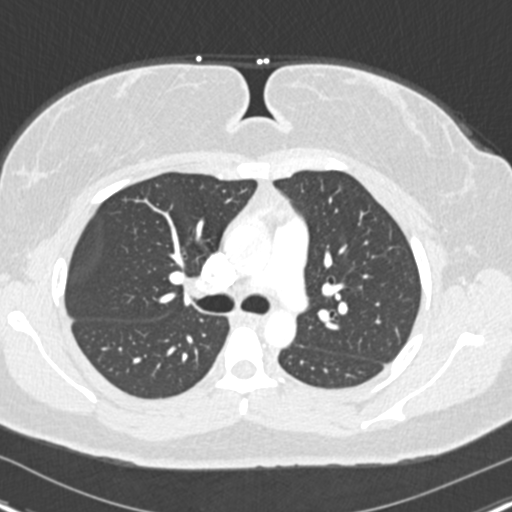
[im 178/256  soft-tissue]
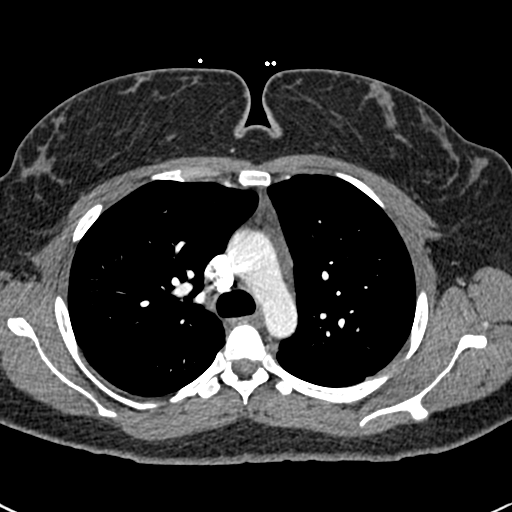
[im 200/256  lung]
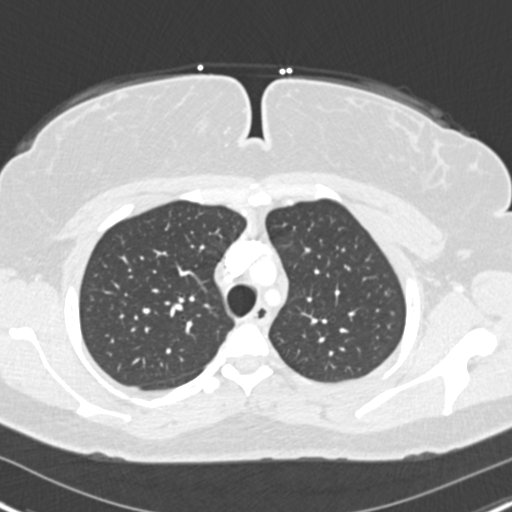
[im 211/256  soft-tissue]
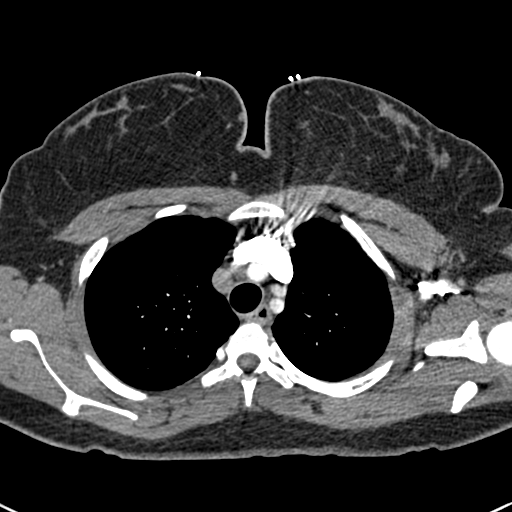
[im 222/256  lung]
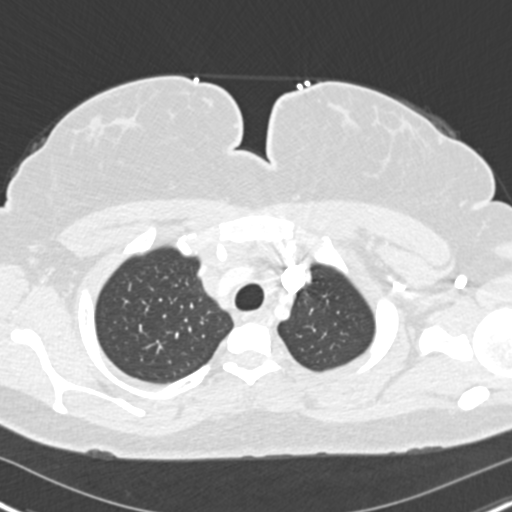
[im 244/256  soft-tissue]
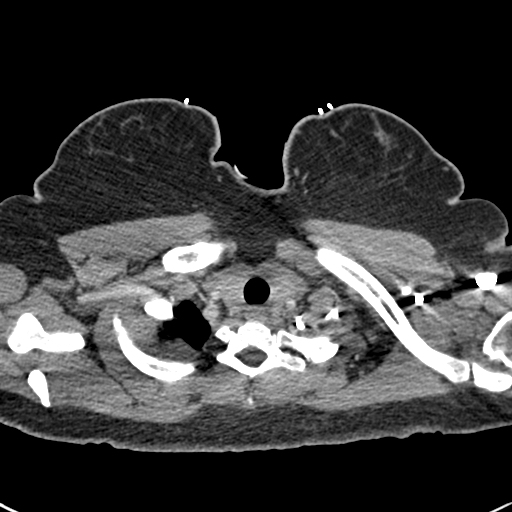

[Series 7: coronal mpr · coronal · 0.52mm/px · 3 of 81 slices shown]
[im 21/81  soft-tissue]
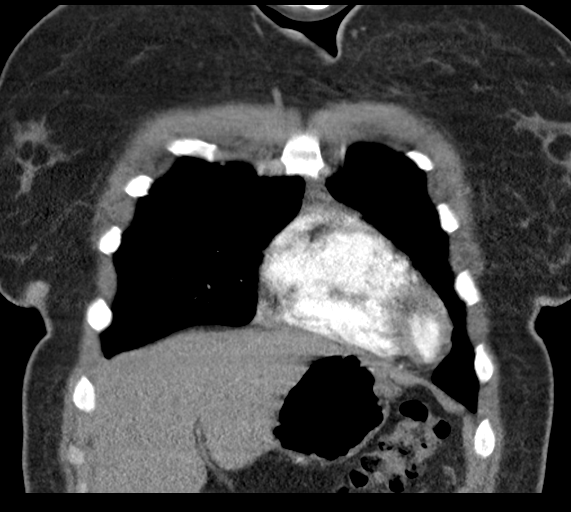
[im 41/81  soft-tissue]
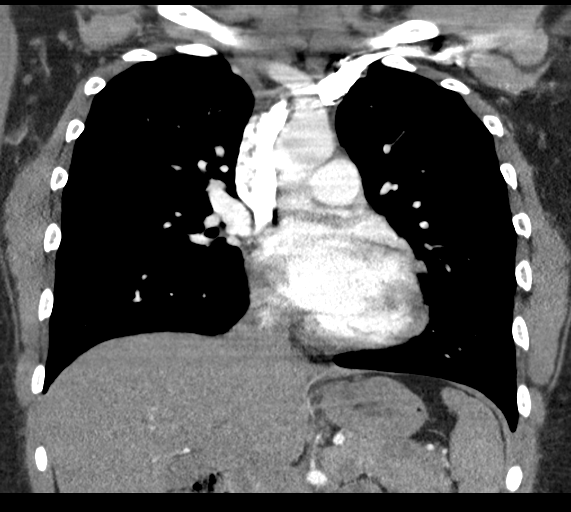
[im 61/81  soft-tissue]
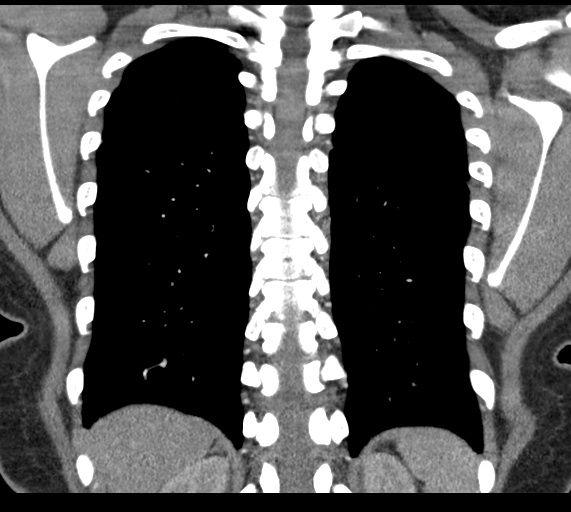

[19 of 46 positions shown; findings below may reference images not displayed]

FINDINGS: Cardiovascular: Satisfactory opacification of the pulmonary arteries
to the segmental level. No evidence of pulmonary embolism. Normal
heart size. No pericardial effusion. Bovine aortic arch incidentally
noted.

Mediastinum/Nodes: No enlarged mediastinal, hilar, or axillary lymph
nodes. Thyroid gland, trachea, and esophagus demonstrate no
significant findings.

Lungs/Pleura: Lungs are clear. No pleural effusion or pneumothorax.

Upper Abdomen: Negative.

Musculoskeletal: Negative.

Review of the MIP images confirms the above findings.
IMPRESSION: Negative for pulmonary embolus.  Negative chest CT.
